# Patient Record
Sex: Female | Born: 1965 | Race: White | Hispanic: No | Marital: Married | Smoking: Current some day smoker
Health system: Southern US, Community
[De-identification: ages and names within clinical notes are randomized; demographics above are authoritative.]

## PROBLEM LIST (undated history)

## (undated) DIAGNOSIS — F419 Anxiety disorder, unspecified: Secondary | ICD-10-CM

## (undated) DIAGNOSIS — R4589 Other symptoms and signs involving emotional state: Secondary | ICD-10-CM

## (undated) DIAGNOSIS — I2699 Other pulmonary embolism without acute cor pulmonale: Secondary | ICD-10-CM

## (undated) DIAGNOSIS — T7840XA Allergy, unspecified, initial encounter: Secondary | ICD-10-CM

## (undated) HISTORY — PX: CHOLECYSTECTOMY: SHX55

## (undated) HISTORY — PX: BACK SURGERY: SHX140

## (undated) HISTORY — DX: Allergy, unspecified, initial encounter: T78.40XA

## (undated) HISTORY — DX: Anxiety disorder, unspecified: F41.9

## (undated) HISTORY — PX: NASAL SINUS SURGERY: SHX719

---

## 1998-01-15 ENCOUNTER — Other Ambulatory Visit: Admission: RE | Admit: 1998-01-15 | Discharge: 1998-01-15 | Payer: Self-pay | Admitting: *Deleted

## 1999-02-11 ENCOUNTER — Other Ambulatory Visit: Admission: RE | Admit: 1999-02-11 | Discharge: 1999-02-11 | Payer: Self-pay | Admitting: *Deleted

## 2000-02-06 ENCOUNTER — Other Ambulatory Visit: Admission: RE | Admit: 2000-02-06 | Discharge: 2000-02-06 | Payer: Self-pay | Admitting: *Deleted

## 2001-03-03 ENCOUNTER — Other Ambulatory Visit: Admission: RE | Admit: 2001-03-03 | Discharge: 2001-03-03 | Payer: Self-pay | Admitting: *Deleted

## 2002-04-20 ENCOUNTER — Other Ambulatory Visit: Admission: RE | Admit: 2002-04-20 | Discharge: 2002-04-20 | Payer: Self-pay | Admitting: *Deleted

## 2003-07-27 ENCOUNTER — Other Ambulatory Visit: Admission: RE | Admit: 2003-07-27 | Discharge: 2003-07-27 | Payer: Self-pay | Admitting: *Deleted

## 2005-07-18 ENCOUNTER — Emergency Department: Payer: Self-pay | Admitting: Emergency Medicine

## 2009-06-08 ENCOUNTER — Ambulatory Visit: Payer: Self-pay | Admitting: Unknown Physician Specialty

## 2010-06-19 ENCOUNTER — Ambulatory Visit: Payer: Self-pay | Admitting: Family Medicine

## 2010-12-16 ENCOUNTER — Emergency Department: Payer: Self-pay | Admitting: Internal Medicine

## 2011-11-17 ENCOUNTER — Ambulatory Visit: Payer: Self-pay

## 2012-02-23 ENCOUNTER — Emergency Department: Payer: Self-pay | Admitting: Emergency Medicine

## 2012-03-17 ENCOUNTER — Emergency Department: Payer: Self-pay | Admitting: Emergency Medicine

## 2012-03-29 ENCOUNTER — Emergency Department: Payer: Self-pay | Admitting: Emergency Medicine

## 2012-03-29 LAB — CBC
HCT: 45.4 % (ref 35.0–47.0)
HGB: 15.7 g/dL (ref 12.0–16.0)
MCH: 33.8 pg (ref 26.0–34.0)
MCHC: 34.5 g/dL (ref 32.0–36.0)
MCV: 98 fL (ref 80–100)
Platelet: 463 10*3/uL — ABNORMAL HIGH (ref 150–440)
RBC: 4.63 10*6/uL (ref 3.80–5.20)
RDW: 15.3 % — ABNORMAL HIGH (ref 11.5–14.5)
WBC: 9.5 10*3/uL (ref 3.6–11.0)

## 2012-03-29 LAB — URINALYSIS, COMPLETE
Bilirubin,UR: NEGATIVE
Glucose,UR: NEGATIVE mg/dL (ref 0–75)
Leukocyte Esterase: NEGATIVE
Nitrite: NEGATIVE
Ph: 5 (ref 4.5–8.0)
Protein: 100
RBC,UR: 10 /HPF (ref 0–5)
Specific Gravity: 1.042 (ref 1.003–1.030)
Squamous Epithelial: 24
WBC UR: 10 /HPF (ref 0–5)

## 2012-03-29 LAB — COMPREHENSIVE METABOLIC PANEL
Albumin: 4.4 g/dL (ref 3.4–5.0)
Alkaline Phosphatase: 98 U/L (ref 50–136)
Anion Gap: 6 — ABNORMAL LOW (ref 7–16)
BUN: 18 mg/dL (ref 7–18)
Bilirubin,Total: 0.9 mg/dL (ref 0.2–1.0)
Calcium, Total: 9.5 mg/dL (ref 8.5–10.1)
Chloride: 108 mmol/L — ABNORMAL HIGH (ref 98–107)
Co2: 28 mmol/L (ref 21–32)
Creatinine: 0.74 mg/dL (ref 0.60–1.30)
EGFR (African American): >60
EGFR (Non-African Amer.): >60
Glucose: 102 mg/dL — ABNORMAL HIGH (ref 65–99)
Osmolality: 285 (ref 275–301)
Potassium: 3.6 mmol/L (ref 3.5–5.1)
SGOT(AST): 21 U/L (ref 15–37)
SGPT (ALT): 50 U/L (ref 12–78)
Sodium: 142 mmol/L (ref 136–145)
Total Protein: 8.2 g/dL (ref 6.4–8.2)

## 2012-03-29 LAB — LIPASE, BLOOD: Lipase: 156 U/L (ref 73–393)

## 2012-03-29 LAB — AMYLASE: Amylase: 49 U/L (ref 25–115)

## 2012-03-29 LAB — PREGNANCY, URINE: Pregnancy Test, Urine: NEGATIVE m[IU]/mL

## 2012-03-31 LAB — URINE CULTURE

## 2012-12-12 ENCOUNTER — Emergency Department: Payer: Self-pay | Admitting: Internal Medicine

## 2014-07-19 ENCOUNTER — Ambulatory Visit: Payer: Self-pay | Admitting: Physical Medicine and Rehabilitation

## 2014-11-09 ENCOUNTER — Emergency Department: Admit: 2014-11-09 | Disposition: A | Discharge status: Home / Self Care | Payer: Self-pay | Admitting: Student

## 2014-12-24 ENCOUNTER — Emergency Department
Admission: EM | Admit: 2014-12-24 | Discharge: 2014-12-24 | Disposition: A | Discharge status: Home / Self Care | Payer: Medicare Other | Attending: Emergency Medicine | Admitting: Emergency Medicine

## 2014-12-24 ENCOUNTER — Encounter: Payer: Self-pay | Admitting: *Deleted

## 2014-12-24 DIAGNOSIS — Z720 Tobacco use: Secondary | ICD-10-CM | POA: Diagnosis not present

## 2014-12-24 DIAGNOSIS — Z79899 Other long term (current) drug therapy: Secondary | ICD-10-CM | POA: Insufficient documentation

## 2014-12-24 DIAGNOSIS — M5117 Intervertebral disc disorders with radiculopathy, lumbosacral region: Secondary | ICD-10-CM | POA: Insufficient documentation

## 2014-12-24 DIAGNOSIS — M545 Low back pain: Secondary | ICD-10-CM | POA: Diagnosis present

## 2014-12-24 DIAGNOSIS — Z880 Allergy status to penicillin: Secondary | ICD-10-CM | POA: Insufficient documentation

## 2014-12-24 DIAGNOSIS — M5137 Other intervertebral disc degeneration, lumbosacral region: Secondary | ICD-10-CM

## 2014-12-24 DIAGNOSIS — R4589 Other symptoms and signs involving emotional state: Secondary | ICD-10-CM | POA: Insufficient documentation

## 2014-12-24 DIAGNOSIS — Z791 Long term (current) use of non-steroidal anti-inflammatories (NSAID): Secondary | ICD-10-CM | POA: Insufficient documentation

## 2014-12-24 DIAGNOSIS — M5432 Sciatica, left side: Secondary | ICD-10-CM

## 2014-12-24 HISTORY — DX: Other symptoms and signs involving emotional state: R45.89

## 2014-12-24 MED ORDER — HYDROMORPHONE HCL 1 MG/ML IJ SOLN: 0.5000 mg | Freq: Once | INTRAMUSCULAR | Status: DC

## 2014-12-24 MED ORDER — HYDROCODONE-ACETAMINOPHEN 5-325 MG PO TABS: 1.0000 | ORAL_TABLET | ORAL | Status: DC | PRN

## 2014-12-24 MED ORDER — MELOXICAM 15 MG PO TABS: 15.0000 mg | ORAL_TABLET | Freq: Every day | ORAL | Status: DC

## 2014-12-24 MED ORDER — MEPERIDINE HCL 25 MG/ML IJ SOLN
INTRAMUSCULAR | Status: AC
  Administered 2014-12-24: 25 mg via INTRAMUSCULAR
  Filled 2014-12-24: qty 1

## 2014-12-24 MED ORDER — METOCLOPRAMIDE HCL 5 MG/ML IJ SOLN: 10.0000 mg | Freq: Once | INTRAMUSCULAR | Status: DC

## 2014-12-24 MED ORDER — MEPERIDINE HCL 25 MG/ML IJ SOLN
25.0000 mg | Freq: Once | INTRAMUSCULAR | Status: AC
  Administered 2014-12-24: 25 mg via INTRAMUSCULAR

## 2014-12-24 MED ORDER — CYCLOBENZAPRINE HCL 5 MG PO TABS: 5.0000 mg | ORAL_TABLET | Freq: Three times a day (TID) | ORAL | Status: DC | PRN

## 2014-12-24 NOTE — Discharge Instructions (Signed)
Radicular Pain Radicular pain in either the arm or leg is usually from a bulging or herniated disk in the spine. A piece of the herniated disk may press against the nerves as the nerves exit the spine. This causes pain which is felt at the tips of the nerves down the arm or leg. Other causes of radicular pain may include:  Fractures.  Heart disease.  Cancer.  An abnormal and usually degenerative state of the nervous system or nerves (neuropathy). Diagnosis may require CT or MRI scanning to determine the primary cause.  Nerves that start at the neck (nerve roots) may cause radicular pain in the outer shoulder and arm. It can spread down to the thumb and fingers. The symptoms vary depending on which nerve root has been affected. In most cases radicular pain improves with conservative treatment. Neck problems may require physical therapy, a neck collar, or cervical traction. Treatment may take many weeks, and surgery may be considered if the symptoms do not improve.  Conservative treatment is also recommended for sciatica. Sciatica causes pain to radiate from the lower back or buttock area down the leg into the foot. Often there is a history of back problems. Most patients with sciatica are better after 2 to 4 weeks of rest and other supportive care. Short term bed rest can reduce the disk pressure considerably. Sitting, however, is not a good position since this increases the pressure on the disk. You should avoid bending, lifting, and all other activities which make the problem worse. Traction can be used in severe cases. Surgery is usually reserved for patients who do not improve within the first months of treatment. Only take over-the-counter or prescription medicines for pain, discomfort, or fever as directed by your caregiver. Narcotics and muscle relaxants may help by relieving more severe pain and spasm and by providing mild sedation. Cold or massage can give significant relief. Spinal manipulation  is not recommended. It can increase the degree of disc protrusion. Epidural steroid injections are often effective treatment for radicular pain. These injections deliver medicine to the spinal nerve in the space between the protective covering of the spinal cord and back bones (vertebrae). Your caregiver can give you more information about steroid injections. These injections are most effective when given within two weeks of the onset of pain.  You should see your caregiver for follow up care as recommended. A program for neck and back injury rehabilitation with stretching and strengthening exercises is an important part of management.  SEEK IMMEDIATE MEDICAL CARE IF:  You develop increased pain, weakness, or numbness in your arm or leg.  You develop difficulty with bladder or bowel control.  You develop abdominal pain. Document Released: 09/04/2004 Document Revised: 10/20/2011 Document Reviewed: 11/20/2008 [facility] Patient Information 2015 [place], [place]. This information is not intended to replace advice given to you by your health care provider. Make sure you discuss any questions you have with your health care provider.   Take the prescription meds as directed.  Follow-up with your provider/specialist as scheduled.

## 2014-12-24 NOTE — ED Provider Notes (Signed)
[facility] Emergency Department Provider Note ?____________________________________________ ? Time seen: 1338 ? I have reviewed the triage vital signs and the nursing notes. ________ HISTORY ? Chief Complaint Back Pain  HPI  Lorraine Rose is a 49 y.o. female who reports to the ED with flare of her left sciatic pain. She reports that over the last few days she's had increasing left lotion any pain above her baseline. She denies injury, trauma, slip or fall, she also denies incontinence. She was here about 6-8 weeks ago for treatment of the same and reported response to the injection medicines and the perception medicines she was sent home with. She is expected to see Dr. Sharyne Peach [facility] neurology for surgical consultation. Her surgical history is significant for L5-S1 fusion and laminectomy. She normally takes ibuprofen and Tylenol for her pain, denies any current or previous prescription medications, other than those assigned on her last ED visit.  Past Medical History  Diagnosis Date  . Depressed affect    Patient Active Problem List   Diagnosis Date Noted  . Depressed affect   ? History reviewed. No pertinent past surgical history. ? Current Outpatient Rx  Name  Route  Sig  Dispense  Refill  . cyclobenzaprine (FLEXERIL) 5 MG tablet   Oral   Take 1 tablet (5 mg total) by mouth 3 (three) times daily as needed for muscle spasms.   15 tablet   0   . HYDROcodone-acetaminophen (NORCO) 5-325 MG per tablet   Oral   Take 1 tablet by mouth every 4 (four) hours as needed for moderate pain.   6 tablet   0   . meloxicam (MOBIC) 15 MG tablet   Oral   Take 1 tablet (15 mg total) by mouth daily.   30 tablet   0   ? Allergies Penicillins ? No family history on file. ? Social History History  Substance Use Topics  . Smoking status: Current Some Day Smoker  . Smokeless tobacco: Not on file  . Alcohol Use: No   Review of Systems  Constitutional:  Negative for fever. HEENT: Negative for head trauma, visual changes, sore throat. Cardiovascular: Negative for chest pain. Respiratory: Negative for shortness of breath. Musculoskeletal: Negative for back pain. Skin: Negative for rash. Neurological: Negative for headaches, focal weakness or numbness.  10-point ROS otherwise negative. ____________________________________________  PHYSICAL EXAM:  VITAL SIGNS: ED Triage Vitals  Enc Vitals Group     BP 12/24/14 1135 133/89 mmHg     Pulse Rate 12/24/14 1135 113     Resp --      Temp 12/24/14 1135 98.1 F (36.7 C)     Temp src --      SpO2 12/24/14 1135 99 %     Weight 12/24/14 1135 125 lb (56.7 kg)     Height 12/24/14 1135 5\' 6"  (1.676 m)     Head Cir --      Peak Flow --      Pain Score 12/24/14 1136 10     Pain Loc --      Pain Edu? --      Excl. in [place]? --    Constitutional: Alert and oriented. Well appearing and in no distress. HEENT:Normocephalic and atraumatic.  PERRL. Normal extraocular movements.  No congestion/rhinnorhea. Mucous membranes are moist. Neck: Supple. No cervical lymphadenopathy. Cardiovascular: Normal rate, regular rhythm. No murmurs, rubs, or gallops. Normal and symmetric distal pulses are present in all extremities.  Respiratory: Normal respiratory effort without  tachypnea. Breath sounds are clear and equal bilaterally. No wheezes/rales/rhonchi. Gastrointestinal: Soft and nontender. No distention. No abdominal bruits. There is no CVA tenderness. Musculoskeletal: Nontender with normal range of motion in all extremities. No joint effusions.  No lower extremity tenderness nor edema. Neurologic:  Normal speech and language. CN II-XII grossly intact. No gait instability. NL DTRs bilaterally. Normal toe dorsiflexion. Skin:  Skin is warm, dry and intact. No rash noted. Psychiatric: Mood and affect are normal. Patient exhibits appropriate insight and judgment. _____________ PROCEDURES ? Procedure(s) performed:  Demerol 25 mg IM  Critical Care performed: None ______________________________________________________ INITIAL IMPRESSION / ASSESSMENT AND PLAN / ED COURSE ? Acute flare of chronic radicular back pain. Prescription meds given. Follow-up with [facility] as scheduled.    Pertinent labs & imaging results that were available during my care of the patient were reviewed by me and considered in my medical decision making (see chart for details). ____________________________________________ FINAL CLINICAL IMPRESSION(S) / ED DIAGNOSES?  Final diagnoses:  Sciatica, left      Melvenia Needles, PA-C 12/24/14 1927  Lavonia Drafts, MD 12/25/14 [phone]

## 2014-12-24 NOTE — ED Notes (Signed)
States meloxicam worked well when on it

## 2015-10-23 ENCOUNTER — Encounter (INDEPENDENT_AMBULATORY_CARE_PROVIDER_SITE_OTHER): Payer: Self-pay

## 2015-10-23 ENCOUNTER — Ambulatory Visit: Payer: Medicare Other | Attending: Pain Medicine | Admitting: Pain Medicine

## 2015-10-23 ENCOUNTER — Encounter: Payer: Self-pay | Admitting: Pain Medicine

## 2015-10-23 VITALS — BP 118/87 | HR 94 | Temp 98.3°F | Resp 16 | Ht 66.0 in | Wt 130.0 lb

## 2015-10-23 DIAGNOSIS — M5416 Radiculopathy, lumbar region: Secondary | ICD-10-CM

## 2015-10-23 DIAGNOSIS — M545 Low back pain: Secondary | ICD-10-CM | POA: Insufficient documentation

## 2015-10-23 DIAGNOSIS — G4700 Insomnia, unspecified: Secondary | ICD-10-CM | POA: Insufficient documentation

## 2015-10-23 DIAGNOSIS — M4690 Unspecified inflammatory spondylopathy, site unspecified: Secondary | ICD-10-CM | POA: Insufficient documentation

## 2015-10-23 DIAGNOSIS — M2578 Osteophyte, vertebrae: Secondary | ICD-10-CM | POA: Diagnosis not present

## 2015-10-23 DIAGNOSIS — M533 Sacrococcygeal disorders, not elsewhere classified: Secondary | ICD-10-CM | POA: Insufficient documentation

## 2015-10-23 DIAGNOSIS — Z9889 Other specified postprocedural states: Secondary | ICD-10-CM | POA: Diagnosis not present

## 2015-10-23 DIAGNOSIS — M79606 Pain in leg, unspecified: Secondary | ICD-10-CM | POA: Diagnosis present

## 2015-10-23 DIAGNOSIS — M5136 Other intervertebral disc degeneration, lumbar region: Secondary | ICD-10-CM | POA: Insufficient documentation

## 2015-10-23 DIAGNOSIS — Z981 Arthrodesis status: Secondary | ICD-10-CM | POA: Diagnosis not present

## 2015-10-23 DIAGNOSIS — M5116 Intervertebral disc disorders with radiculopathy, lumbar region: Secondary | ICD-10-CM | POA: Diagnosis not present

## 2015-10-23 DIAGNOSIS — F418 Other specified anxiety disorders: Secondary | ICD-10-CM | POA: Insufficient documentation

## 2015-10-23 DIAGNOSIS — M47816 Spondylosis without myelopathy or radiculopathy, lumbar region: Secondary | ICD-10-CM

## 2015-10-23 NOTE — Progress Notes (Signed)
Safety precautions to be maintained throughout the outpatient stay will include: orient to surroundings, keep bed in low position, maintain call bell within reach at all times, provide assistance with transfer out of bed and ambulation.  

## 2015-10-23 NOTE — Progress Notes (Signed)
Subjective:    Patient ID: Lorraine Rose, female    DOB: 1965-08-17, 50 y.o.   MRN: [id number]  HPI The patient is a 50 year old female who comes to pain management at the request of Dr Leanord Hawking further evaluation and treatment of lower back and lower extremity pain. The patient states that she is with prior surgical intervention of the lumbar region with initial surgery consisting of laminectomy and 2004 and fusion in 2006. The patient also has undergone interventional treatment with to procedures performed by Dr. Sharlet Salina Asst. of lumbar epidural steroid injections. The patient states that she had the insidious onset of her lower back pain. The patient states that the pain involves the lower back and lower extremity on the left predominantly on today's visit we reviewed patient's MRI and discussed patient's symptoms. The patient stated that her pain was agonizing feeling await horrible nagging pressure-like pulsating sharp shooting pain that awakened her from sleep and interfered with ability to go to sleep. The patient stated that the pain was associated with weakness tingling spasms in the pain increased with walking and sitting standing squatting stooping lifting bending area the patient stated that the pain had decreased in the past with cold packs and resting. Also discussed patient's prior medications and patient stated that she was without medications prescribed for pain this time. The patient stated that her previous medications have consisted of Neurontin meloxicam and hydrocodone acetaminophen (5/325 mg). We informed patient that we would not be able to promise that he would ever prescribe medications for her. We informed patient that we would decide upon prescribing medications for her based on further evaluation of her. After review patient's condition we suggested that patient be considered for lumbar facet, medial branch nerve, blocks to be performed at time of return appointment. We also  discussed other procedures including lumbosacral selective nerve root blocks lumbar epidural steroid injections and other procedures including radiofrequency rhizolysis and spinal cord stimulation. The patient was with understanding and wished to proceed with lumbar facet, medial branch nerve, blocks to be performed at time return appointment. All agreed to suggested treatment plan.   Review of Systems   Cardiovascular: Unremarkable   Pulmonary: Unremarkable  Neurological: Unremarkable  Psychological: Anxiety Depression Panic attacks Insomnia  Gastrointestinal: Unremarkable  Genitourinary: Unremarkable  Hematologic: Unremarkable  Endocrine: Unremarkable  Rheumatological: Unremarkable  Musculoskeletal: Unremarkable  Other significant: Unremarkable     Objective:   Physical Exam  There was tenderness to palpation of paraspinal musculature in the cervical region cervical facet region of mild degree with mild to moderate tenderness of the splenius capitis and occipitalis musculature region. The patient appeared to be unremarkable Spurling's maneuver. There was minimal tenderness of the acromial clavicular and glenohumeral joint regions. The patient appeared to be with bilaterally equal grip strength. Tinel and Phalen's maneuver were without increased pain of significant degree. Palpation of the thoracic region thoracic facet region was attends to palpation of mild degree with moderate tenderness to palpation of the lower thoracic region without crepitus noted. There was tenderness over the lumbar facet lumbar paraspinal musculature region with well-healed surgical scar of the lumbar region without increased warmth and erythema in the region of the scar. Extension and palpation of the lumbar facets reproduced moderately severe to severe pain with lateral bending rotation extension and palpation of the lumbar facets reproducing severe pain. There was tenderness of the PSIS and  PII S region of moderately severe degree as well. There was mild tenderness along the  greater trochanteric region and iliotibial band region. Straight leg raising was tolerated to approximately 20 without a definite increase of pain with dorsiflexion noted. There was negative clonus negative Homans. DTRs appeared to be trace. EHL strength was decreased. There was negative clonus negative Homans. No definite sensory deficit or dermatomal distribution detected. Abdomen nontender with no costovertebral tenderness noted.      Assessment & Plan:   Degenerative disc disease lumbar spine T12-L1: No significant disc bulge. No evidence of neural foraminal  stenosis. No central canal stenosis.   L1-L2: No significant disc bulge. No evidence of neural foraminal  stenosis. No central canal stenosis.   L2-L3: No significant disc bulge. No evidence of neural foraminal  stenosis. No central canal stenosis.   L3-L4: No significant disc bulge. No evidence of neural foraminal  stenosis. No central canal stenosis.   L4-L5: No significant disc bulge. No evidence of neural foraminal  stenosis. No central canal stenosis. Mild bilateral facet  arthropathy.   L5-S1: Posterior spinal fusion. Left paracentral disc osteophyte  complex abutting the left intraspinal S1 nerve root. No evidence of  neural foraminal stenosis. No central canal stenosis.   IMPRESSION:  1. At L5-S1 there is posterior spinal fusion. There is a left  paracentral disc osteophyte complex abutting the left intraspinal S1  nerve root.   Status post lumbar surgery 2 (lumbar laminectomy and lumbar fusion)  Lumbar facet syndrome  Lumbar radiculopathy  Sacroiliac joint dysfunction     PLAN  Continue present medications . Prior medications have included Neurontin (gabapentin) meloxicam and hydrocodone acetaminophen(05/325mg ) . Patient not taking these medications at this time   Lumbar facet, medial branch  nerve, blocks to be performed at time of return appointment  F/U PCP Dr.Lithavong  for evaliation of  BP and general medical  condition  F/U surgical evaluation. May consider pending follow-up evaluations  F/U neurological evaluation. May consider PNCV/EMG studies and other studies pending follow-up evaluations  May consider radiofrequency rhizolysis or intraspinal procedures pending response to present treatment and F/U evaluation   Patient to call [facility] should patient have concerns prior to scheduled return appointment.

## 2015-10-23 NOTE — Patient Instructions (Addendum)
Continue present medications . Prior medications have included Neurontin (gabapentin) meloxicam and hydrocodone acetaminophen(05/325mg ) . Patient not taking these medications at this time   Lumbar facet, medial branch nerve, blocks to be performed at time of return appointment  F/U PCP Dr.Lithavong  for evaliation of  BP and general medical  condition  F/U surgical evaluation. May consider pending follow-up evaluations  F/U neurological evaluation. May consider PNCV/EMG studies and other studies pending follow-up evaluations  May consider radiofrequency rhizolysis or intraspinal procedures pending response to present treatment and F/U evaluation   Patient to call Pain Management Center should patient have concerns prior to scheduled return appointment. Pain Management Discharge Instructions  General Discharge Instructions :  If you need to reach your doctor call: Monday-Friday 8:00 am - 4:00 pm at [phone] or toll free [phone].  After clinic hours [phone] to have operator reach doctor.  Bring all of your medication bottles to all your appointments in the pain clinic.  To cancel or reschedule your appointment with Pain Management please remember to call 24 hours in advance to avoid a fee.  Refer to the educational materials which you have been given on: General Risks, I had my Procedure. Discharge Instructions, Post Sedation.  Post Procedure Instructions:  The drugs you were given will stay in your system until tomorrow, so for the next 24 hours you should not drive, make any legal decisions or drink any alcoholic beverages.  You may eat anything you prefer, but it is better to start with liquids then soups and crackers, and gradually work up to solid foods.  Please notify your doctor immediately if you have any unusual bleeding, trouble breathing or pain that is not related to your normal pain.  Depending on the type of procedure that was done, some parts of your body  may feel week and/or numb.  This usually clears up by tonight or the next day.  Walk with the use of an assistive device or accompanied by an adult for the 24 hours.  You may use ice on the affected area for the first 24 hours.  Put ice in a Ziploc bag and cover with a towel and place against area 15 minutes on 15 minutes off.  You may switch to heat after 24 hours.GENERAL RISKS AND COMPLICATIONS  What are the risk, side effects and possible complications? Generally speaking, most procedures are safe.  However, with any procedure there are risks, side effects, and the possibility of complications.  The risks and complications are dependent upon the sites that are lesioned, or the type of nerve block to be performed.  The closer the procedure is to the spine, the more serious the risks are.  Great care is taken when placing the radio frequency needles, block needles or lesioning probes, but sometimes complications can occur. 1. Infection: Any time there is an injection through the skin, there is a risk of infection.  This is why sterile conditions are used for these blocks.  There are four possible types of infection. 1. Localized skin infection. 2. Central Nervous System Infection-This can be in the form of Meningitis, which can be deadly. 3. Epidural Infections-This can be in the form of an epidural abscess, which can cause pressure inside of the spine, causing compression of the spinal cord with subsequent paralysis. This would require an emergency surgery to decompress, and there are no guarantees that the patient would recover from the paralysis. 4. Discitis-This is an infection of the intervertebral discs.  It occurs  in about 1% of discography procedures.  It is difficult to treat and it may lead to surgery.        2. Pain: the needles have to go through skin and soft tissues, will cause soreness.       3. Damage to internal structures:  The nerves to be lesioned may be near blood vessels or     other nerves which can be potentially damaged.       4. Bleeding: Bleeding is more common if the patient is taking blood thinners such as  aspirin, Coumadin, Ticiid, Plavix, etc., or if he/she have some genetic predisposition  such as hemophilia. Bleeding into the spinal canal can cause compression of the spinal  cord with subsequent paralysis.  This would require an emergency surgery to  decompress and there are no guarantees that the patient would recover from the  paralysis.       5. Pneumothorax:  Puncturing of a lung is a possibility, every time a needle is introduced in  the area of the chest or upper back.  Pneumothorax refers to free air around the  collapsed lung(s), inside of the thoracic cavity (chest cavity).  Another two possible  complications related to a similar event would include: Hemothorax and Chylothorax.   These are variations of the Pneumothorax, where instead of air around the collapsed  lung(s), you may have blood or chyle, respectively.       6. Spinal headaches: They may occur with any procedures in the area of the spine.       7. Persistent CSF (Cerebro-Spinal Fluid) leakage: This is a rare problem, but may occur  with prolonged intrathecal or epidural catheters either due to the formation of a fistulous  track or a dural tear.       8. Nerve damage: By working so close to the spinal cord, there is always a possibility of  nerve damage, which could be as serious as a permanent spinal cord injury with  paralysis.       9. Death:  Although rare, severe deadly allergic reactions known as "Anaphylactic  reaction" can occur to any of the medications used.      10. Worsening of the symptoms:  We can always make thing worse.  What are the chances of something like this happening? Chances of any of this occuring are extremely low.  By statistics, you have more of a chance of getting killed in a motor vehicle accident: while driving to the hospital than any of the above occurring .   Nevertheless, you should be aware that they are possibilities.  In general, it is similar to taking a shower.  Everybody knows that you can slip, hit your head and get killed.  Does that mean that you should not shower again?  Nevertheless always keep in mind that statistics do not mean anything if you happen to be on the wrong side of them.  Even if a procedure has a 1 (one) in a 1,000,000 (million) chance of going wrong, it you happen to be that one..Also, keep in mind that by statistics, you have more of a chance of having something go wrong when taking medications.  Who should not have this procedure? If you are on a blood thinning medication (e.g. Coumadin, Plavix, see list of "Blood Thinners"), or if you have an active infection going on, you should not have the procedure.  If you are taking any blood thinners, please inform your physician.  How  should I prepare for this procedure?  Do not eat or drink anything at least six hours prior to the procedure.  Bring a driver with you .  It cannot be a taxi.  Come accompanied by an adult that can drive you back, and that is strong enough to help you if your legs get weak or numb from the local anesthetic.  Take all of your medicines the morning of the procedure with just enough water to swallow them.  If you have diabetes, make sure that you are scheduled to have your procedure done first thing in the morning, whenever possible.  If you have diabetes, take only half of your insulin dose and notify our nurse that you have done so as soon as you arrive at the clinic.  If you are diabetic, but only take blood sugar pills (oral hypoglycemic), then do not take them on the morning of your procedure.  You may take them after you have had the procedure.  Do not take aspirin or any aspirin-containing medications, at least eleven (11) days prior to the procedure.  They may prolong bleeding.  Wear loose fitting clothing that may be easy to take off and  that you would not mind if it got stained with Betadine or blood.  Do not wear any jewelry or perfume  Remove any nail coloring.  It will interfere with some of our monitoring equipment.  NOTE: Remember that this is not meant to be interpreted as a complete list of all possible complications.  Unforeseen problems may occur.  BLOOD THINNERS The following drugs contain aspirin or other products, which can cause increased bleeding during surgery and should not be taken for 2 weeks prior to and 1 week after surgery.  If you should need take something for relief of minor pain, you may take acetaminophen which is found in Tylenol,m Datril, Anacin-3 and Panadol. It is not blood thinner. The products listed below are.  Do not take any of the products listed below in addition to any listed on your instruction sheet.  A.P.C or A.P.C with Codeine Codeine Phosphate Capsules #3 Ibuprofen Ridaura  ABC compound Congesprin Imuran rimadil  Advil Cope Indocin Robaxisal  Alka-Seltzer Effervescent Pain Reliever and Antacid Coricidin or Coricidin-D  Indomethacin Rufen  Alka-Seltzer plus Cold Medicine Cosprin Ketoprofen S-A-C Tablets  Anacin Analgesic Tablets or Capsules Coumadin Korlgesic Salflex  Anacin Extra Strength Analgesic tablets or capsules CP-2 Tablets Lanoril Salicylate  Anaprox Cuprimine Capsules Levenox Salocol  Anexsia-D Dalteparin Magan Salsalate  Anodynos Darvon compound Magnesium Salicylate Sine-off  Ansaid Dasin Capsules Magsal Sodium Salicylate  Anturane Depen Capsules Marnal Soma  APF Arthritis pain formula Dewitt's Pills Measurin Stanback  Argesic Dia-Gesic Meclofenamic Sulfinpyrazone  Arthritis Bayer Timed Release Aspirin Diclofenac Meclomen Sulindac  Arthritis pain formula Anacin Dicumarol Medipren Supac  Analgesic (Safety coated) Arthralgen Diffunasal Mefanamic Suprofen  Arthritis Strength Bufferin Dihydrocodeine Mepro Compound Suprol  Arthropan liquid Dopirydamole Methcarbomol with  Aspirin Synalgos  ASA tablets/Enseals Disalcid Micrainin Tagament  Ascriptin Doan's Midol Talwin  Ascriptin A/D Dolene Mobidin Tanderil  Ascriptin Extra Strength Dolobid Moblgesic Ticlid  Ascriptin with Codeine Doloprin or Doloprin with Codeine Momentum Tolectin  Asperbuf Duoprin Mono-gesic Trendar  Aspergum Duradyne Motrin or Motrin IB Triminicin  Aspirin plain, buffered or enteric coated Durasal Myochrisine Trigesic  Aspirin Suppositories Easprin Nalfon Trillsate  Aspirin with Codeine Ecotrin Regular or Extra Strength Naprosyn Uracel  Atromid-S Efficin Naproxen Ursinus  Auranofin Capsules Elmiron Neocylate Vanquish  Axotal Emagrin Norgesic Verin  Azathioprine Empirin  or Empirin with Codeine Normiflo Vitamin E  Azolid Emprazil Nuprin Voltaren  Bayer Aspirin plain, buffered or children's or timed BC Tablets or powders Encaprin Orgaran Warfarin Sodium  Buff-a-Comp Enoxaparin Orudis Zorpin  Buff-a-Comp with Codeine Equegesic Os-Cal-Gesic   Buffaprin Excedrin plain, buffered or Extra Strength Oxalid   Bufferin Arthritis Strength Feldene Oxphenbutazone   Bufferin plain or Extra Strength Feldene Capsules Oxycodone with Aspirin   Bufferin with Codeine Fenoprofen Fenoprofen Pabalate or Pabalate-SF   Buffets II Flogesic Panagesic   Buffinol plain or Extra Strength Florinal or Florinal with Codeine Panwarfarin   Buf-Tabs Flurbiprofen Penicillamine   Butalbital Compound Four-way cold tablets Penicillin   Butazolidin Fragmin Pepto-Bismol   Carbenicillin Geminisyn Percodan   Carna Arthritis Reliever Geopen Persantine   Carprofen Gold's salt Persistin   Chloramphenicol Goody's Phenylbutazone   Chloromycetin Haltrain Piroxlcam   Clmetidine heparin Plaquenil   Cllnoril Hyco-pap Ponstel   Clofibrate Hydroxy chloroquine Propoxyphen         Before stopping any of these medications, be sure to consult the physician who ordered them.  Some, such as Coumadin (Warfarin) are ordered to prevent or  treat serious conditions such as "deep thrombosis", "pumonary embolisms", and other heart problems.  The amount of time that you may need off of the medication may also vary with the medication and the reason for which you were taking it.  If you are taking any of these medications, please make sure you notify your pain physician before you undergo any procedures.         Facet Joint Block The facet joints connect the bones of the spine (vertebrae). They make it possible for you to bend, twist, and make other movements with your spine. They also prevent you from overbending, overtwisting, and making other excessive movements.  A facet joint block is a procedure where a numbing medicine (anesthetic) is injected into a facet joint. Often, a type of anti-inflammatory medicine called a steroid is also injected. A facet joint block may be done for two reasons:  2. Diagnosis. A facet joint block may be done as a test to see whether neck or back pain is caused by a worn-down or infected facet joint. If the pain gets better after a facet joint block, it means the pain is probably coming from the facet joint. If the pain does not get better, it means the pain is probably not coming from the facet joint.  3. Therapy. A facet joint block may be done to relieve neck or back pain caused by a facet joint. A facet joint block is only done as a therapy if the pain does not improve with medicine, exercise programs, physical therapy, and other forms of pain management. LET [facility] CARE PROVIDER KNOW ABOUT:   Any allergies you have.   All medicines you are taking, including vitamins, herbs, eyedrops, and over-the-counter medicines and creams.   Previous problems you or members of your family have had with the use of anesthetics.   Any blood disorders you have had.   Other health problems you have. RISKS AND COMPLICATIONS Generally, having a facet joint block is safe. However, as with any procedure,  complications can occur. Possible complications associated with having a facet joint block include:   Bleeding.   Injury to a nerve near the injection site.   Pain at the injection site.   Weakness or numbness in areas controlled by nerves near the injection site.   Infection.   Temporary fluid retention.  Allergic reaction to anesthetics or medicines used during the procedure. BEFORE THE PROCEDURE   Follow your health care provider's instructions if you are taking dietary supplements or medicines. You may need to stop taking them or reduce your dosage.   Do not take any new dietary supplements or medicines without asking your health care provider first.   Follow your health care provider's instructions about eating and drinking before the procedure. You may need to stop eating and drinking several hours before the procedure.   Arrange to have an adult drive you home after the procedure. PROCEDURE 12. You may need to remove your clothing and dress in an open-back gown so that your health care provider can access your spine.  13. The procedure will be done while you are lying on an X-ray table. Most of the time you will be asked to lie on your stomach, but you may be asked to lie in a different position if an injection will be made in your neck.  14. Special machines will be used to monitor your oxygen levels, heart rate, and blood pressure.  15. If an injection will be made in your neck, an intravenous (IV) tube will be inserted into one of your veins. Fluids and medicine will flow directly into your body through the IV tube.  16. The area over the facet joint where the injection will be made will be cleaned with an antiseptic soap. The surrounding skin will be covered with sterile drapes.  17. An anesthetic will be applied to your skin to make the injection area numb. You may feel a temporary stinging or burning sensation.  18. A video X-ray machine will be used to  locate the joint. A contrast dye may be injected into the facet joint area to help with locating the joint.  19. When the joint is located, an anesthetic medicine will be injected into the joint through the needle.  51. Your health care provider will ask you whether you feel pain relief. If you do feel relief, a steroid may be injected to provide pain relief for a longer period of time. If you do not feel relief or feel only partial relief, additional injections of an anesthetic may be made in other facet joints.  21. The needle will be removed, the skin will be cleansed, and bandages will be applied.  AFTER THE PROCEDURE   You will be observed for 15-30 minutes before being allowed to go home. Do not drive. Have an adult drive you or take a taxi or public transportation instead.   If you feel pain relief, the pain will return in several hours or days when the anesthetic wears off.   You may feel pain relief 2-14 days after the procedure. The amount of time this relief lasts varies from person to person.   It is normal to feel some tenderness over the injected area(s) for 2 days following the procedure.   If you have diabetes, you may have a temporary increase in blood sugar.   This information is not intended to replace advice given to you by your health care provider. Make sure you discuss any questions you have with your health care provider.   Document Released: 12/17/2006 Document Revised: 08/18/2014 Document Reviewed: 05/17/2012 Elsevier Interactive Patient Education 2016 [place] patient stated that she previously had been prescribed Neurontin, meloxicam, and hydrocodone acetaminophen(5/325 mg) the patient stated

## 2015-10-31 ENCOUNTER — Encounter: Payer: Self-pay | Admitting: Pain Medicine

## 2015-10-31 ENCOUNTER — Ambulatory Visit: Payer: Medicare Other | Attending: Pain Medicine | Admitting: Pain Medicine

## 2015-10-31 VITALS — BP 130/85 | HR 73 | Temp 98.3°F | Resp 18 | Ht 66.0 in | Wt 140.0 lb

## 2015-10-31 DIAGNOSIS — M5416 Radiculopathy, lumbar region: Secondary | ICD-10-CM

## 2015-10-31 DIAGNOSIS — M5136 Other intervertebral disc degeneration, lumbar region: Secondary | ICD-10-CM | POA: Diagnosis not present

## 2015-10-31 DIAGNOSIS — M2578 Osteophyte, vertebrae: Secondary | ICD-10-CM | POA: Diagnosis not present

## 2015-10-31 DIAGNOSIS — M51369 Other intervertebral disc degeneration, lumbar region without mention of lumbar back pain or lower extremity pain: Secondary | ICD-10-CM

## 2015-10-31 DIAGNOSIS — M545 Low back pain: Secondary | ICD-10-CM | POA: Diagnosis present

## 2015-10-31 DIAGNOSIS — M533 Sacrococcygeal disorders, not elsewhere classified: Secondary | ICD-10-CM

## 2015-10-31 DIAGNOSIS — Z981 Arthrodesis status: Secondary | ICD-10-CM

## 2015-10-31 DIAGNOSIS — M79606 Pain in leg, unspecified: Secondary | ICD-10-CM | POA: Insufficient documentation

## 2015-10-31 DIAGNOSIS — M47816 Spondylosis without myelopathy or radiculopathy, lumbar region: Secondary | ICD-10-CM

## 2015-10-31 DIAGNOSIS — Z9889 Other specified postprocedural states: Secondary | ICD-10-CM

## 2015-10-31 MED ORDER — FENTANYL CITRATE (PF) 100 MCG/2ML IJ SOLN: 100.0000 ug | Freq: Once | INTRAMUSCULAR | Status: DC

## 2015-10-31 MED ORDER — LACTATED RINGERS IV SOLN: 1000.0000 mL | INTRAVENOUS | Status: DC

## 2015-10-31 MED ORDER — CIPROFLOXACIN IN D5W 400 MG/200ML IV SOLN: 400.0000 mg | Freq: Once | INTRAVENOUS | Status: DC

## 2015-10-31 MED ORDER — TRIAMCINOLONE ACETONIDE 40 MG/ML IJ SUSP
INTRAMUSCULAR | Status: AC
  Administered 2015-10-31: 10:00:00
  Filled 2015-10-31: qty 1

## 2015-10-31 MED ORDER — BUPIVACAINE HCL (PF) 0.25 % IJ SOLN
INTRAMUSCULAR | Status: AC
  Administered 2015-10-31: 10:00:00
  Filled 2015-10-31: qty 30

## 2015-10-31 MED ORDER — FENTANYL CITRATE (PF) 100 MCG/2ML IJ SOLN
INTRAMUSCULAR | Status: AC
  Administered 2015-10-31: 100 ug via INTRAVENOUS
  Filled 2015-10-31: qty 2

## 2015-10-31 MED ORDER — TRIAMCINOLONE ACETONIDE 40 MG/ML IJ SUSP: 40.0000 mg | Freq: Once | INTRAMUSCULAR | Status: DC

## 2015-10-31 MED ORDER — MIDAZOLAM HCL 5 MG/5ML IJ SOLN
INTRAMUSCULAR | Status: AC
  Administered 2015-10-31: 5 mg via INTRAVENOUS
  Filled 2015-10-31: qty 5

## 2015-10-31 MED ORDER — ORPHENADRINE CITRATE 30 MG/ML IJ SOLN: 60.0000 mg | Freq: Once | INTRAMUSCULAR | Status: DC

## 2015-10-31 MED ORDER — CIPROFLOXACIN IN D5W 400 MG/200ML IV SOLN
INTRAVENOUS | Status: AC
  Administered 2015-10-31: 400 mg via INTRAVENOUS
  Filled 2015-10-31: qty 200

## 2015-10-31 MED ORDER — ORPHENADRINE CITRATE 30 MG/ML IJ SOLN
INTRAMUSCULAR | Status: AC
  Filled 2015-10-31: qty 2

## 2015-10-31 MED ORDER — CIPROFLOXACIN HCL 250 MG PO TABS: 250.0000 mg | ORAL_TABLET | Freq: Two times a day (BID) | ORAL | Status: DC

## 2015-10-31 MED ORDER — MIDAZOLAM HCL 5 MG/5ML IJ SOLN: 5.0000 mg | Freq: Once | INTRAMUSCULAR | Status: DC

## 2015-10-31 MED ORDER — BUPIVACAINE HCL (PF) 0.25 % IJ SOLN: 30.0000 mL | Freq: Once | INTRAMUSCULAR | Status: DC

## 2015-10-31 NOTE — Patient Instructions (Addendum)
PLAN   Continue present medications and please obtain Cipro antibiotic today and begin taking Cipro antibiotic as prescribed. Prior medications have included Neurontin (gabapentin) meloxicam and hydrocodone acetaminophen(05/325mg ) . Patient not taking these medications at this time   F/U PCP Dr.Lithavong  for evaliation of  BP and general medical  condition  F/U surgical evaluation. May consider pending follow-up evaluations  F/U neurological evaluation. May consider PNCV/EMG studies and other studies pending follow-up evaluations  May consider radiofrequency rhizolysis or intraspinal procedures pending response to present treatment and F/U evaluation   Patient to call Pain Management Center should patient have concerns prior to scheduled return appointment. Pain Management Discharge Instructions  General Discharge Instructions :  If you need to reach your doctor call: Monday-Friday 8:00 am - 4:00 pm at [phone] or toll free [phone].  After clinic hours [phone] to have operator reach doctor.  Bring all of your medication bottles to all your appointments in the pain clinic.  To cancel or reschedule your appointment with Pain Management please remember to call 24 hours in advance to avoid a fee.  Refer to the educational materials which you have been given on: General Risks, I had my Procedure. Discharge Instructions, Post Sedation.  Post Procedure Instructions:  The drugs you were given will stay in your system until tomorrow, so for the next 24 hours you should not drive, make any legal decisions or drink any alcoholic beverages.  You may eat anything you prefer, but it is better to start with liquids then soups and crackers, and gradually work up to solid foods.  Please notify your doctor immediately if you have any unusual bleeding, trouble breathing or pain that is not related to your normal pain.  Depending on the type of procedure that was done, some parts of your  body may feel week and/or numb.  This usually clears up by tonight or the next day.  Walk with the use of an assistive device or accompanied by an adult for the 24 hours.  You may use ice on the affected area for the first 24 hours.  Put ice in a Ziploc bag and cover with a towel and place against area 15 minutes on 15 minutes off.  You may switch to heat after 24 hours.

## 2015-10-31 NOTE — Progress Notes (Signed)
Safety precautions to be maintained throughout the outpatient stay will include: orient to surroundings, keep bed in low position, maintain call bell within reach at all times, provide assistance with transfer out of bed and ambulation.  

## 2015-10-31 NOTE — Progress Notes (Signed)
Subjective:    Patient ID: Lorraine Rose, female    DOB: 19-Jan-1966, 50 y.o.   MRN: [id number]  HPI  PROCEDURE PERFORMED: Lumbar facet (medial branch block)   NOTE: The patient is a 50 y.o. female who returns to [facility] for further evaluation and treatment of pain involving the lumbar and lower extremity region. MRI  revealed the patient to be with evidence of degenerative disc disease lumbar spine T12-L1: No significant disc bulge. No evidence of neural foraminal  stenosis. No central canal stenosis.   L1-L2: No significant disc bulge. No evidence of neural foraminal  stenosis. No central canal stenosis.   L2-L3: No significant disc bulge. No evidence of neural foraminal  stenosis. No central canal stenosis.   L3-L4: No significant disc bulge. No evidence of neural foraminal  stenosis. No central canal stenosis.   L4-L5: No significant disc bulge. No evidence of neural foraminal  stenosis. No central canal stenosis. Mild bilateral facet  arthropathy.   L5-S1: Posterior spinal fusion. Left paracentral disc osteophyte  complex abutting the left intraspinal S1 nerve root. No evidence of  neural foraminal stenosis. No central canal stenosis.   IMPRESSION:  1. At L5-S1 there is posterior spinal fusion. There is a left  paracentral disc osteophyte complex abutting the left intraspinal S1  nerve root.  . There is concern regarding significant component of patient's pain being due to lumbar facet syndrome. The risks, benefits, and expectations of the procedure have been discussed and explained to the patient who was understanding and in agreement with suggested treatment plan. We will proceed with interventional treatment as discussed and as explained to the patient who was understanding and wished to proceed with procedure as planned.   DESCRIPTION OF PROCEDURE: Lumbar facet (medial branch block) with IV Versed, IV fentanyl conscious sedation,  EKG, blood pressure, pulse, and pulse oximetry monitoring. The procedure was performed with the patient in the prone position. Betadine prep of proposed entry site performed.   NEEDLE PLACEMENT AT: Left L 2 lumbar facet (medial branch block). Under fluoroscopic guidance with oblique orientation of 15 degrees, a 22-gauge needle was inserted at the L 2 vertebral body level with needle placed at the targeted area of Burton's Eye or Eye of the Scotty Dog with documentation of needle placement in the superior and lateral border of targeted area of Burton's Eye or Eye of the Scotty Dog with oblique orientation of 15 degrees. Following documentation of needle placement at the L 2 vertebral body level, needle placement was then accomplished at the L 3 vertebral body level.   NEEDLE PLACEMENT AT L3, L4, and L5 VERTEBRAL BODY LEVELS ON THE LEFT SIDE The procedure was performed at the L3, L4, and L5 vertebral body levels exactly as was performed at the L 2 vertebral body level utilizing the same technique and under fluoroscopic guidance.  NEEDLE PLACEMENT AT THE SACRAL ALA with AP view of the lumbosacral spine. With the patient in the prone position, Betadine prep of proposed entry site accomplished, a 22 gauge needle was inserted in the region of the sacral ala (groove formed by the superior articulating process of S1 and the sacral wing). Following documentation of needle placement at the sacral ala,    Needle placement was then verified at all levels on lateral view. Following documentation of needle placement at all levels on lateral view and following negative aspiration for heme and CSF, each level was injected with 1 mL of 0.25% bupivacaine with Kenalog  LUMBAR FACET, MEDIAL BRANCH NERVE, BLOCKS PERFORMED ON THE RIGHT SIDE   The procedure was performed on the right side exactly as was performed on the left side at the same levels and utilizing the same technique under fluoroscopic  guidance.    Myoneural block injections of the gluteal musculature region Following Betadine prep of proposed entry site a 22-gauge needle was inserted in the gluteal musculature region and following negative aspiration 1 cc of 0.25% bupivacaine was injected into the gluteal musculature region times two.     The patient tolerated the procedure well.   A total of 40 mg of Kenalog was utilized for the procedure.   PLAN:  1. Medications: The patient will continue presently prescribed medications. 2. May consider modification of treatment regimen at time of return appointment pending response to treatment rendered on today's visit. 3. The patient is to follow-up with primary care physician [facility]   for further evaluation of blood pressure and general medical condition status post steroid injection performed on today's visit. 4. Surgical follow-up evaluation.Has been addressed  5. Neurological follow-up evaluation.May consider PNCV EMG studies and other studies  6. The patient may be candidate for radiofrequency procedures, implantation type procedures, and other treatment pending response to treatment and follow-up evaluation. 7. The patient has been advised to call the [facility] prior to scheduled return appointment should there be significant change in condition or should patient have other concerns regarding condition prior to scheduled return appointment.  The patient is understanding and in agreement with suggested treatment plan.   Review of Systems     Objective:   Physical Exam        Assessment & Plan:

## 2015-11-01 ENCOUNTER — Telehealth: Payer: Self-pay | Admitting: *Deleted

## 2015-11-01 ENCOUNTER — Telehealth: Payer: Self-pay

## 2015-11-01 NOTE — Telephone Encounter (Signed)
Spoke with patient and she explains that she is having a great deal of pain in her R hip.  Patient had a facet block on yesterday and states that when she left our office she was not having pain and pain started back around 6pm  Last evening.  Patient states that she has been using ice and is taking ibuprofen and tylenol for discomfort.  Advised that these s/s sound normal and that it will take time for the steroids to do their job and what she was doing was correct.  She also asked why Dr Primus Bravo would not prescribe pain medication?  I did tell her that a letter had been faxed to Dr Sharlet Salina for permission to prescribe and one proactive thing she could do would be to call that office to make sure they had received that and would send permission to give medications.  Also, told her that she could discuss all of this with Dr Primus Bravo at her next visit.  Patient is agreeable to this.  I told patient that if she became worse or things did not get better to call our office back.  Patient stated that she would go to the ED.  I told her that was an option.  Also, told her everything she was describing sounded normal postprocedure and that it is possible that with time pain would improve after steroids begin to work.  Patient verbalizes u/o information given.

## 2015-11-01 NOTE — Telephone Encounter (Signed)
Spoke with patients husband, number I have for Lorraine Rose is not working.  He states that is her number and in fact she has tried to call us this morning d/t her pain and discomfort that she is having after procedure.  He asked if I would try her back and that possibly she was on the other line and this is why the call may not be going through.

## 2015-11-02 ENCOUNTER — Other Ambulatory Visit: Payer: Self-pay | Admitting: Pain Medicine

## 2015-11-02 NOTE — Telephone Encounter (Signed)
Hip pain increased since procedure, can hardly walk

## 2015-11-20 ENCOUNTER — Encounter: Payer: Self-pay | Admitting: Pain Medicine

## 2015-11-20 ENCOUNTER — Ambulatory Visit: Payer: Medicare Other | Attending: Pain Medicine | Admitting: Pain Medicine

## 2015-11-20 VITALS — BP 127/84 | HR 90 | Temp 98.3°F | Resp 16 | Ht 66.0 in | Wt 140.0 lb

## 2015-11-20 DIAGNOSIS — M5116 Intervertebral disc disorders with radiculopathy, lumbar region: Secondary | ICD-10-CM | POA: Insufficient documentation

## 2015-11-20 DIAGNOSIS — M79604 Pain in right leg: Secondary | ICD-10-CM | POA: Diagnosis present

## 2015-11-20 DIAGNOSIS — M79605 Pain in left leg: Secondary | ICD-10-CM | POA: Diagnosis present

## 2015-11-20 DIAGNOSIS — M533 Sacrococcygeal disorders, not elsewhere classified: Secondary | ICD-10-CM | POA: Diagnosis not present

## 2015-11-20 DIAGNOSIS — M2578 Osteophyte, vertebrae: Secondary | ICD-10-CM | POA: Diagnosis not present

## 2015-11-20 DIAGNOSIS — M47816 Spondylosis without myelopathy or radiculopathy, lumbar region: Secondary | ICD-10-CM

## 2015-11-20 DIAGNOSIS — Z981 Arthrodesis status: Secondary | ICD-10-CM | POA: Diagnosis not present

## 2015-11-20 DIAGNOSIS — M5416 Radiculopathy, lumbar region: Secondary | ICD-10-CM

## 2015-11-20 DIAGNOSIS — Z9889 Other specified postprocedural states: Secondary | ICD-10-CM | POA: Diagnosis not present

## 2015-11-20 DIAGNOSIS — M545 Low back pain: Secondary | ICD-10-CM | POA: Diagnosis present

## 2015-11-20 DIAGNOSIS — M51369 Other intervertebral disc degeneration, lumbar region without mention of lumbar back pain or lower extremity pain: Secondary | ICD-10-CM

## 2015-11-20 DIAGNOSIS — M5136 Other intervertebral disc degeneration, lumbar region: Secondary | ICD-10-CM

## 2015-11-20 NOTE — Patient Instructions (Addendum)
PLAN   Continue present medications . Prior medications have included Neurontin (gabapentin) meloxicam and hydrocodone acetaminophen(05/325mg ) . Patient not taking these medications at this time   Block of nerves to the sacroiliac joint to be performed at time return appointment  F/U PCP Dr.Lithavong  for evaliation of  BP and general medical  condition  F/U surgical evaluation. May consider pending follow-up evaluations  Please repeat UDS today as discussed  F/U neurological evaluation. May consider PNCV/EMG studies and other studies pending follow-up evaluations  May consider radiofrequency rhizolysis or intraspinal procedures pending response to present treatment and F/U evaluation   Patient to call [facility] should patient have concerns prior to scheduled return appointment.Sacroiliac (SI) Joint Injection Patient Information  Description: The sacroiliac joint connects the scrum (very low back and tailbone) to the ilium (a pelvic bone which also forms half of the hip joint).  Normally this joint experiences very little motion.  When this joint becomes inflamed or unstable low back and or hip and pelvis pain may result.  Injection of this joint with local anesthetics (numbing medicines) and steroids can provide diagnostic information and reduce pain.  This injection is performed with the aid of x-ray guidance into the tailbone area while you are lying on your stomach.   You may experience an electrical sensation down the leg while this is being done.  You may also experience numbness.  We also may ask if we are reproducing your normal pain during the injection.  Conditions which may be treated SI injection:   Low back, buttock, hip or leg pain  Preparation for the Injection:  1. Do not eat any solid food or dairy products within 8 hours of your appointment.  2. You may drink clear liquids up to 3 hours before appointment.  Clear liquids include water, black coffee, juice  or soda.  No milk or cream please. 3. You may take your regular medications, including pain medications with a sip of water before your appointment.  Diabetics should hold regular insulin (if take separately) and take 1/2 normal NPH dose the morning of the procedure.  Carry some sugar containing items with you to your appointment. 4. A driver must accompany you and be prepared to drive you home after your procedure. 5. Bring all of your current medications with you. 6. An IV may be inserted and sedation may be given at the discretion of the physician. 7. A blood pressure cuff, EKG and other monitors will often be applied during the procedure.  Some patients may need to have extra oxygen administered for a short period.  8. You will be asked to provide medical information, including your allergies, prior to the procedure.  We must know immediately if you are taking blood thinners (like Coumadin/Warfarin) or if you are allergic to IV iodine contrast (dye).  We must know if you could possible be pregnant.  Possible side effects:   Bleeding from needle site  Infection (rare, may require surgery)  Nerve injury (rare)  Numbness & tingling (temporary)  A brief convulsion or seizure  Light-headedness (temporary)  Pain at injection site (several days)  Decreased blood pressure (temporary)  Weakness in the leg (temporary)   Call if you experience:   New onset weakness or numbness of an extremity below the injection site that last more than 8 hours.  Hives or difficulty breathing ( go to the emergency room)  Inflammation or drainage at the injection site  Any new symptoms which are concerning  to you  Please note:  Although the local anesthetic injected can often make your back/ hip/ buttock/ leg feel good for several hours after the injections, the pain will likely return.  It takes 3-7 days for steroids to work in the sacroiliac area.  You may not notice any pain relief for at least  that one week.  If effective, we will often do a series of three injections spaced 3-6 weeks apart to maximally decrease your pain.  After the initial series, we generally will wait some months before a repeat injection of the same type.  If you have any questions, please call [phone] [place]  What are the risk, side effects and possible complications? Generally speaking, most procedures are safe.  However, with any procedure there are risks, side effects, and the possibility of complications.  The risks and complications are dependent upon the sites that are lesioned, or the type of nerve block to be performed.  The closer the procedure is to the spine, the more serious the risks are.  Great care is taken when placing the radio frequency needles, block needles or lesioning probes, but sometimes complications can occur. 1. Infection: Any time there is an injection through the skin, there is a risk of infection.  This is why sterile conditions are used for these blocks.  There are four possible types of infection. 1. Localized skin infection. 2. Central Nervous System Infection-This can be in the form of Meningitis, which can be deadly. 3. Epidural Infections-This can be in the form of an epidural abscess, which can cause pressure inside of the spine, causing compression of the spinal cord with subsequent paralysis. This would require an emergency surgery to decompress, and there are no guarantees that the patient would recover from the paralysis. 4. Discitis-This is an infection of the intervertebral discs.  It occurs in about 1% of discography procedures.  It is difficult to treat and it may lead to surgery.        2. Pain: the needles have to go through skin and soft tissues, will cause soreness.       3. Damage to internal structures:  The nerves to be lesioned may be near blood vessels or    other nerves which can be  potentially damaged.       4. Bleeding: Bleeding is more common if the patient is taking blood thinners such as  aspirin, Coumadin, Ticiid, Plavix, etc., or if he/she have some genetic predisposition  such as hemophilia. Bleeding into the spinal canal can cause compression of the spinal  cord with subsequent paralysis.  This would require an emergency surgery to  decompress and there are no guarantees that the patient would recover from the  paralysis.       5. Pneumothorax:  Puncturing of a lung is a possibility, every time a needle is introduced in  the area of the chest or upper back.  Pneumothorax refers to free air around the  collapsed lung(s), inside of the thoracic cavity (chest cavity).  Another two possible  complications related to a similar event would include: Hemothorax and Chylothorax.   These are variations of the Pneumothorax, where instead of air around the collapsed  lung(s), you may have blood or chyle, respectively.       6. Spinal headaches: They may occur with any procedures in the area of the spine.       7. Persistent CSF (  Cerebro-Spinal Fluid) leakage: This is a rare problem, but may occur  with prolonged intrathecal or epidural catheters either due to the formation of a fistulous  track or a dural tear.       8. Nerve damage: By working so close to the spinal cord, there is always a possibility of  nerve damage, which could be as serious as a permanent spinal cord injury with  paralysis.       9. Death:  Although rare, severe deadly allergic reactions known as "Anaphylactic  reaction" can occur to any of the medications used.      10. Worsening of the symptoms:  We can always make thing worse.  What are the chances of something like this happening? Chances of any of this occuring are extremely low.  By statistics, you have more of a chance of getting killed in a motor vehicle accident: while driving to the hospital than any of the above occurring .  Nevertheless, you should be  aware that they are possibilities.  In general, it is similar to taking a shower.  Everybody knows that you can slip, hit your head and get killed.  Does that mean that you should not shower again?  Nevertheless always keep in mind that statistics do not mean anything if you happen to be on the wrong side of them.  Even if a procedure has a 1 (one) in a 1,000,000 (million) chance of going wrong, it you happen to be that one..Also, keep in mind that by statistics, you have more of a chance of having something go wrong when taking medications.  Who should not have this procedure? If you are on a blood thinning medication (e.g. Coumadin, Plavix, see list of "Blood Thinners"), or if you have an active infection going on, you should not have the procedure.  If you are taking any blood thinners, please inform your physician.  How should I prepare for this procedure?  Do not eat or drink anything at least six hours prior to the procedure.  Bring a driver with you .  It cannot be a taxi.  Come accompanied by an adult that can drive you back, and that is strong enough to help you if your legs get weak or numb from the local anesthetic.  Take all of your medicines the morning of the procedure with just enough water to swallow them.  If you have diabetes, make sure that you are scheduled to have your procedure done first thing in the morning, whenever possible.  If you have diabetes, take only half of your insulin dose and notify our nurse that you have done so as soon as you arrive at the clinic.  If you are diabetic, but only take blood sugar pills (oral hypoglycemic), then do not take them on the morning of your procedure.  You may take them after you have had the procedure.  Do not take aspirin or any aspirin-containing medications, at least eleven (11) days prior to the procedure.  They may prolong bleeding.  Wear loose fitting clothing that may be easy to take off and that you would not mind if it  got stained with Betadine or blood.  Do not wear any jewelry or perfume  Remove any nail coloring.  It will interfere with some of our monitoring equipment.  NOTE: Remember that this is not meant to be interpreted as a complete list of all possible complications.  Unforeseen problems may occur.  BLOOD THINNERS The following drugs  contain aspirin or other products, which can cause increased bleeding during surgery and should not be taken for 2 weeks prior to and 1 week after surgery.  If you should need take something for relief of minor pain, you may take acetaminophen which is found in Tylenol,m Datril, Anacin-3 and Panadol. It is not blood thinner. The products listed below are.  Do not take any of the products listed below in addition to any listed on your instruction sheet.  A.P.C or A.P.C with Codeine Codeine Phosphate Capsules #3 Ibuprofen Ridaura  ABC compound Congesprin Imuran rimadil  Advil Cope Indocin Robaxisal  Alka-Seltzer Effervescent Pain Reliever and Antacid Coricidin or Coricidin-D  Indomethacin Rufen  Alka-Seltzer plus Cold Medicine Cosprin Ketoprofen S-A-C Tablets  Anacin Analgesic Tablets or Capsules Coumadin Korlgesic Salflex  Anacin Extra Strength Analgesic tablets or capsules CP-2 Tablets Lanoril Salicylate  Anaprox Cuprimine Capsules Levenox Salocol  Anexsia-D Dalteparin Magan Salsalate  Anodynos Darvon compound Magnesium Salicylate Sine-off  Ansaid Dasin Capsules Magsal Sodium Salicylate  Anturane Depen Capsules Marnal Soma  APF Arthritis pain formula Dewitt's Pills Measurin Stanback  Argesic Dia-Gesic Meclofenamic Sulfinpyrazone  Arthritis Bayer Timed Release Aspirin Diclofenac Meclomen Sulindac  Arthritis pain formula Anacin Dicumarol Medipren Supac  Analgesic (Safety coated) Arthralgen Diffunasal Mefanamic Suprofen  Arthritis Strength Bufferin Dihydrocodeine Mepro Compound Suprol  Arthropan liquid Dopirydamole Methcarbomol with Aspirin Synalgos  ASA  tablets/Enseals Disalcid Micrainin Tagament  Ascriptin Doan's Midol Talwin  Ascriptin A/D Dolene Mobidin Tanderil  Ascriptin Extra Strength Dolobid Moblgesic Ticlid  Ascriptin with Codeine Doloprin or Doloprin with Codeine Momentum Tolectin  Asperbuf Duoprin Mono-gesic Trendar  Aspergum Duradyne Motrin or Motrin IB Triminicin  Aspirin plain, buffered or enteric coated Durasal Myochrisine Trigesic  Aspirin Suppositories Easprin Nalfon Trillsate  Aspirin with Codeine Ecotrin Regular or Extra Strength Naprosyn Uracel  Atromid-S Efficin Naproxen Ursinus  Auranofin Capsules Elmiron Neocylate Vanquish  Axotal Emagrin Norgesic Verin  Azathioprine Empirin or Empirin with Codeine Normiflo Vitamin E  Azolid Emprazil Nuprin Voltaren  Bayer Aspirin plain, buffered or children's or timed BC Tablets or powders Encaprin Orgaran Warfarin Sodium  Buff-a-Comp Enoxaparin Orudis Zorpin  Buff-a-Comp with Codeine Equegesic Os-Cal-Gesic   Buffaprin Excedrin plain, buffered or Extra Strength Oxalid   Bufferin Arthritis Strength Feldene Oxphenbutazone   Bufferin plain or Extra Strength Feldene Capsules Oxycodone with Aspirin   Bufferin with Codeine Fenoprofen Fenoprofen Pabalate or Pabalate-SF   Buffets II Flogesic Panagesic   Buffinol plain or Extra Strength Florinal or Florinal with Codeine Panwarfarin   Buf-Tabs Flurbiprofen Penicillamine   Butalbital Compound Four-way cold tablets Penicillin   Butazolidin Fragmin Pepto-Bismol   Carbenicillin Geminisyn Percodan   Carna Arthritis Reliever Geopen Persantine   Carprofen Gold's salt Persistin   Chloramphenicol Goody's Phenylbutazone   Chloromycetin Haltrain Piroxlcam   Clmetidine heparin Plaquenil   Cllnoril Hyco-pap Ponstel   Clofibrate Hydroxy chloroquine Propoxyphen         Before stopping any of these medications, be sure to consult the physician who ordered them.  Some, such as Coumadin (Warfarin) are ordered to prevent or treat serious conditions  such as "deep thrombosis", "pumonary embolisms", and other heart problems.  The amount of time that you may need off of the medication may also vary with the medication and the reason for which you were taking it.  If you are taking any of these medications, please make sure you notify your pain physician before you undergo any procedures.

## 2015-11-20 NOTE — Progress Notes (Signed)
Safety precautions to be maintained throughout the outpatient stay will include: orient to surroundings, keep bed in low position, maintain call bell within reach at all times, provide assistance with transfer out of bed and ambulation.  

## 2015-11-20 NOTE — Progress Notes (Signed)
Subjective:    Patient ID: Lorraine Rose, female    DOB: 03-14-1966, 50 y.o.   MRN: [id number]  HPI  The patient is a 50 year old female who returns to pain management for further evaluation and treatment of pain involving the lower back and lower extremity regions. The patient states that she continues to have significant pain. The patient is status post lumbar facet, medial branch nerve blocks. The patient states that the pain radiation the lumbar region toward the buttocks on the left as well as on the right. On today's visit we reviewed patient's urine drug screen was positive for alcohol. We informed patient that we will be unable to prescribe any medications at this time. We informed patient that we would like to repeat urine drug screen and will consider prescribing medications pending follow-up evaluations including review of the urine drug screen. We will remain available to consider modification of treatment regimen as discussed with patient. At the present time patient appeared to be with pain due to sacroiliac joint involvement with severe increased pain with palpation over the PSIS and PII S region. Incision was made to proceed with scheduling patient for block of nerves to the sacroiliac joint to be performed at time of return appointment. Once again we informed patient that we or on able to promise that we will Prescribe medications for treatment of her pain. We informed patient that we would consider referring her to a tertiary pain clinic if we were unable to provide her with significant relief of her pain or if she decided to do such. We will proceed with interventional treatment consisting of block of nerves to the sacroiliac joint on today's visit and we'll consider additional modifications of treatment regimen pending follow-up evaluation.      Review of Systems     Objective:   Physical Exam  There was tenderness of the splenius capitis and occipitalis region a mild degree  with mild tenderness of the cervical facet cervical paraspinal musculature region. Palpation over the region of the thoracic facet thoracic paraspinal musculatures and was with mild tenderness to palpation of moderate tenderness to palpation with moderate tends to palpation over the lower thoracic paraspinal musculature region associated with moderate muscle spasm. Palpation of the acromioclavicular and glenohumeral joint regions were with tends to palpation of mild degree with unremarkable Spurling's maneuver. Tinel and Phalen's maneuver were without increased pain of significant degree and patient appeared to be with bilaterally equal grip strength. Palpation over the lumbar paraspinal musculatures and lumbar facet region was with moderate to moderately severe discomfort with lateral bending rotation extension and palpation over the lumbar facets reproducing moderate to moderately severe discomfort. Straight leg raising was tolerates approximately 30 without a definite increased pain with dorsiflexion noted. EHL strength appeared to be decreased. No definite sensory deficit or dermatomal distribution detected. Palpation over the PSIS and PII S region reproduces moderate discomfort with mild tenderness of the greater trochanteric region and iliotibial band region. There was negative clonus negative Homans. Abdomen was nontender and no costovertebral tenderness was noted       Assessment & Plan:       Degenerative disc disease lumbar spine T12-L1: No significant disc bulge. No evidence of neural foraminal  stenosis. No central canal stenosis.   L1-L2: No significant disc bulge. No evidence of neural foraminal  stenosis. No central canal stenosis.   L2-L3: No significant disc bulge. No evidence of neural foraminal  stenosis. No central canal stenosis.   L3-L4:  No significant disc bulge. No evidence of neural foraminal  stenosis. No central canal stenosis.   L4-L5: No significant  disc bulge. No evidence of neural foraminal  stenosis. No central canal stenosis. Mild bilateral facet  arthropathy.   L5-S1: Posterior spinal fusion. Left paracentral disc osteophyte  complex abutting the left intraspinal S1 nerve root. No evidence of  neural foraminal stenosis. No central canal stenosis.   IMPRESSION:  1. At L5-S1 there is posterior spinal fusion. There is a left  paracentral disc osteophyte complex abutting the left intraspinal S1  nerve root.   Status post lumbar surgery 2 (lumbar laminectomy and lumbar fusion)  Lumbar facet syndrome  Lumbar radiculopathy  Sacroiliac joint dysfunction      PLAN   Continue present medications . Prior medications have included Neurontin (gabapentin) meloxicam and hydrocodone acetaminophen(05/325mg ) . Patient not taking these medications at this time   Block of nerves to the sacroiliac joint to be performed at time return appointment  F/U PCP Dr.Lithavong  for evaliation of  BP and general medical  condition  F/U surgical evaluation. May consider pending follow-up evaluations  Please repeat UDS today as discussed  F/U neurological evaluation. May consider PNCV/EMG studies and other studies pending follow-up evaluations  May consider radiofrequency rhizolysis or intraspinal procedures pending response to present treatment and F/U evaluation   Patient to call [facility] should patient have concerns prior to scheduled return appointment.

## 2015-11-24 LAB — TOXASSURE SELECT 13 (MW), URINE: PDF: 0

## 2015-12-03 ENCOUNTER — Encounter: Payer: Self-pay | Admitting: Pain Medicine

## 2015-12-03 ENCOUNTER — Ambulatory Visit: Payer: Medicare Other | Attending: Pain Medicine | Admitting: Pain Medicine

## 2015-12-03 VITALS — BP 124/73 | HR 91 | Temp 98.6°F | Resp 18 | Ht 66.0 in | Wt 140.0 lb

## 2015-12-03 DIAGNOSIS — Z981 Arthrodesis status: Secondary | ICD-10-CM | POA: Insufficient documentation

## 2015-12-03 DIAGNOSIS — M47816 Spondylosis without myelopathy or radiculopathy, lumbar region: Secondary | ICD-10-CM

## 2015-12-03 DIAGNOSIS — M79606 Pain in leg, unspecified: Secondary | ICD-10-CM | POA: Diagnosis present

## 2015-12-03 DIAGNOSIS — M2578 Osteophyte, vertebrae: Secondary | ICD-10-CM | POA: Insufficient documentation

## 2015-12-03 DIAGNOSIS — M5416 Radiculopathy, lumbar region: Secondary | ICD-10-CM

## 2015-12-03 DIAGNOSIS — M5136 Other intervertebral disc degeneration, lumbar region: Secondary | ICD-10-CM | POA: Insufficient documentation

## 2015-12-03 DIAGNOSIS — M545 Low back pain: Secondary | ICD-10-CM | POA: Diagnosis present

## 2015-12-03 DIAGNOSIS — M533 Sacrococcygeal disorders, not elsewhere classified: Secondary | ICD-10-CM

## 2015-12-03 DIAGNOSIS — Z9889 Other specified postprocedural states: Secondary | ICD-10-CM

## 2015-12-03 DIAGNOSIS — M1288 Other specific arthropathies, not elsewhere classified, other specified site: Secondary | ICD-10-CM | POA: Diagnosis not present

## 2015-12-03 MED ORDER — CEFAZOLIN SODIUM 1 G IJ SOLR
INTRAMUSCULAR | Status: AC
  Administered 2015-12-03: 11:00:00
  Filled 2015-12-03: qty 10

## 2015-12-03 MED ORDER — CIPROFLOXACIN IN D5W 400 MG/200ML IV SOLN: 400.0000 mg | Freq: Once | INTRAVENOUS | Status: DC

## 2015-12-03 MED ORDER — BUPIVACAINE HCL (PF) 0.25 % IJ SOLN
INTRAMUSCULAR | Status: AC
  Administered 2015-12-03: 11:00:00
  Filled 2015-12-03: qty 30

## 2015-12-03 MED ORDER — MIDAZOLAM HCL 5 MG/5ML IJ SOLN
INTRAMUSCULAR | Status: AC
  Administered 2015-12-03: 5 mg via INTRAVENOUS
  Filled 2015-12-03: qty 5

## 2015-12-03 MED ORDER — LACTATED RINGERS IV SOLN: 1000.0000 mL | INTRAVENOUS | Status: DC

## 2015-12-03 MED ORDER — FENTANYL CITRATE (PF) 100 MCG/2ML IJ SOLN: 100.0000 ug | Freq: Once | INTRAMUSCULAR | Status: DC

## 2015-12-03 MED ORDER — CIPROFLOXACIN HCL 250 MG PO TABS
250.0000 mg | ORAL_TABLET | Freq: Two times a day (BID) | ORAL | Status: DC
  Filled 2015-12-03: qty 1

## 2015-12-03 MED ORDER — MIDAZOLAM HCL 5 MG/5ML IJ SOLN: 5.0000 mg | Freq: Once | INTRAMUSCULAR | Status: DC

## 2015-12-03 MED ORDER — BUPIVACAINE HCL (PF) 0.25 % IJ SOLN: 30.0000 mL | Freq: Once | INTRAMUSCULAR | Status: DC

## 2015-12-03 MED ORDER — ORPHENADRINE CITRATE 30 MG/ML IJ SOLN
INTRAMUSCULAR | Status: AC
  Administered 2015-12-03: 11:00:00
  Filled 2015-12-03: qty 2

## 2015-12-03 MED ORDER — CEFAZOLIN SODIUM 1-5 GM-% IV SOLN: 1.0000 g | Freq: Once | INTRAVENOUS | Status: DC

## 2015-12-03 MED ORDER — TRIAMCINOLONE ACETONIDE 40 MG/ML IJ SUSP: 40.0000 mg | Freq: Once | INTRAMUSCULAR | Status: DC

## 2015-12-03 MED ORDER — FENTANYL CITRATE (PF) 100 MCG/2ML IJ SOLN
INTRAMUSCULAR | Status: AC
  Administered 2015-12-03: 100 ug via INTRAVENOUS
  Filled 2015-12-03: qty 2

## 2015-12-03 MED ORDER — CIPROFLOXACIN IN D5W 400 MG/200ML IV SOLN
INTRAVENOUS | Status: AC
  Administered 2015-12-03: 11:00:00
  Filled 2015-12-03: qty 200

## 2015-12-03 MED ORDER — CIPROFLOXACIN IN D5W 400 MG/200ML IV SOLN
INTRAVENOUS | Status: AC
  Filled 2015-12-03: qty 200

## 2015-12-03 MED ORDER — CIPROFLOXACIN IN D5W 400 MG/200ML IV SOLN
400.0000 mg | Freq: Once | INTRAVENOUS | Status: DC
Start: 2015-12-03 — End: 2016-05-05

## 2015-12-03 MED ORDER — CEFUROXIME AXETIL 250 MG PO TABS: 250.0000 mg | ORAL_TABLET | Freq: Two times a day (BID) | ORAL | Status: DC

## 2015-12-03 MED ORDER — ORPHENADRINE CITRATE 30 MG/ML IJ SOLN: 60.0000 mg | Freq: Once | INTRAMUSCULAR | Status: DC

## 2015-12-03 MED ORDER — TRIAMCINOLONE ACETONIDE 40 MG/ML IJ SUSP
INTRAMUSCULAR | Status: AC
  Administered 2015-12-03: 11:00:00
  Filled 2015-12-03: qty 1

## 2015-12-03 NOTE — Progress Notes (Signed)
Subjective:    Patient ID: Lorraine Rose, female    DOB: June 23, 1966, 50 y.o.   MRN: [id number]  HPI  PROCEDURE:  Block of nerves to the sacroiliac joint.   NOTE:  The patient is a 50 y.o. female who returns to the [facility] for further evaluation and treatment of pain involving the lower back and lower extremity region with pain in the region of the buttocks as well. Prior MRI studies reveal    Degenerative disc disease lumbar spine T12-L1: No significant disc bulge. No evidence of neural foraminal  stenosis. No central canal stenosis.   L1-L2: No significant disc bulge. No evidence of neural foraminal  stenosis. No central canal stenosis.   L2-L3: No significant disc bulge. No evidence of neural foraminal  stenosis. No central canal stenosis.   L3-L4: No significant disc bulge. No evidence of neural foraminal  stenosis. No central canal stenosis.   L4-L5: No significant disc bulge. No evidence of neural foraminal  stenosis. No central canal stenosis. Mild bilateral facet  arthropathy.   L5-S1: Posterior spinal fusion. Left paracentral disc osteophyte  complex abutting the left intraspinal S1 nerve root. No evidence of  neural foraminal stenosis. No central canal stenosis.   IMPRESSION:  1. At L5-S1 there is posterior spinal fusion. There is a left  paracentral disc osteophyte complex abutting the left intraspinal S1  nerve root. . The patient is with reproduction of severe pain with palpation over the PSIS and PII S regions.   There is concern regarding a significant component of the patient's pain being due to sacroiliac joint dysfunction The risks, benefits, expectations of the procedure have been discussed and explained to the patient who is understanding and willing to proceed with interventional treatment in attempt to decrease severity of patient's symptoms, minimize the risk of medication escalation and  hopefully retard the  progression of the patient's symptoms. We will proceed with what is felt to be a medically necessary procedure, block of nerves to the sacroiliac joint.   DESCRIPTION OF PROCEDURE:  Block of nerves to the sacroiliac joint.   The patient was taken to the fluoroscopy suite. With the patient in the prone position with EKG, blood pressure, pulse, capnography, and pulse oximetry monitoring, IV Versed, IV fentanyl conscious sedation, Betadine prep of proposed entry site was performed.   Block of nerves at the L5 vertebral body level.   With the patient in prone position, under fluoroscopic guidance, a 22 -gauge needle was inserted at the L5 vertebral body level on the left side. With 15 degrees oblique orientation a 22 -gauge needle was inserted in the region known as Burton's eye or eye of the Scotty dog. Following documentation of needle placement in the area of Burton's eye or eye of the Scotty dog under fluoroscopic guidance, needle placement was then accomplished at the sacral ala level on the left side.   Needle placement at the sacral ala.   With the patient in prone position under fluoroscopic guidance with AP view of the lumbosacral spine, a 22 -gauge needle was inserted in the region known as the sacral ala on the left side. Following documentation of needle placement on the left side under fluoroscopic guidance needle placement was then accomplished at the S1 foramen level.   Needle placement at the S1 foramen level.   With the patient in prone position under fluoroscopic guidance with AP view of the lumbosacral spine and cephalad orientation, a 22 -gauge needle was inserted  at the superior and lateral border of the S1 foramen on the left side. Following documentation of needle placement at the S1 foramen level on the left side, needle placement was then accomplished at the S2 foramen level on the left side.   Needle placement at the S2 foramen level.   With the patient in prone position  with AP view of the lumbosacral spine with cephalad orientation, a 22 - gauge needle was inserted at the superior and lateral border of the S2 foramen under fluoroscopic guidance on the left side. Following needle placement at the L5 vertebral body level, sacral ala, S1 foramen and S2 foramen on the left side, needle placement was verified on lateral view under fluoroscopic guidance.  Following needle placement documentation on lateral view, each needle was injected with 1 mL of 0.25% bupivacaine and Kenalog.   BLOCK OF THE NERVES TO SACROILIAC JOINT ON THE RIGHT SIDE The procedure was performed on the right side at the same levels as was performed on the left side and utilizing the same technique as on the left side and was performed under fluoroscopic guidance as on the left side   A total of 10mg  of Kenalog was utilized for the procedure.   PLAN:  1. Medications: The patient will continue presently prescribed medications.  2. The patient will be considered for modification of treatment regimen pending response to the procedure performed on today's visit.  3. The patient is to follow-up with primary care physician Dr. Richarda Overlie for evaluation of blood pressure and general medical condition following the procedure performed on today's visit.  4. Surgical evaluation as discussed.  5. Neurological evaluation as discussed.  6. The patient may be a candidate for radiofrequency procedures, implantation devices and other treatment pending response to treatment performed on today's visit and follow-up evaluation.  7. The patient has been advised to adhere to proper body mechanics and to avoid activities which may exacerbate the patient's symptoms.   Return appointment to [facility] as scheduled.    Review of Systems     Objective:   Physical Exam        Assessment & Plan:

## 2015-12-03 NOTE — Patient Instructions (Signed)
PLAN   Continue present medications and please obtain Cipro antibiotic today and begin taking Cipro antibiotic as prescribed. Prior medications have included Neurontin (gabapentin) meloxicam and hydrocodone acetaminophen(05/325mg ) . Patient not taking these medications at this time   F/U PCP Dr.Lithavong  for evaliation of  BP and general medical  condition  F/U surgical evaluation. May consider further surgical evaluation pending follow-up evaluations  F/U neurological evaluation. May consider PNCV/EMG studies and other studies pending follow-up evaluations  May consider radiofrequency rhizolysis or intraspinal procedures pending response to present treatment and F/U evaluation   Patient to call [facility] should patient have concerns prior to scheduled return appointment.

## 2015-12-03 NOTE — Progress Notes (Signed)
Patient here today for procedure visit.   Safety precautions to be maintained throughout the outpatient stay will include: orient to surroundings, keep bed in low position, maintain call bell within reach at all times, provide assistance with transfer out of bed and ambulation.  

## 2015-12-04 ENCOUNTER — Telehealth: Payer: Self-pay | Admitting: *Deleted

## 2015-12-04 NOTE — Telephone Encounter (Signed)
No problems post procedure. 

## 2015-12-11 ENCOUNTER — Encounter: Payer: Self-pay | Admitting: Pain Medicine

## 2015-12-11 ENCOUNTER — Ambulatory Visit: Payer: Medicare Other | Attending: Pain Medicine | Admitting: Pain Medicine

## 2015-12-11 VITALS — BP 125/84 | HR 80 | Temp 98.7°F | Resp 18 | Ht 66.0 in | Wt 145.0 lb

## 2015-12-11 DIAGNOSIS — Z981 Arthrodesis status: Secondary | ICD-10-CM | POA: Insufficient documentation

## 2015-12-11 DIAGNOSIS — M47816 Spondylosis without myelopathy or radiculopathy, lumbar region: Secondary | ICD-10-CM

## 2015-12-11 DIAGNOSIS — M5416 Radiculopathy, lumbar region: Secondary | ICD-10-CM

## 2015-12-11 DIAGNOSIS — M533 Sacrococcygeal disorders, not elsewhere classified: Secondary | ICD-10-CM | POA: Diagnosis not present

## 2015-12-11 DIAGNOSIS — M5116 Intervertebral disc disorders with radiculopathy, lumbar region: Secondary | ICD-10-CM | POA: Insufficient documentation

## 2015-12-11 DIAGNOSIS — Z9889 Other specified postprocedural states: Secondary | ICD-10-CM | POA: Diagnosis not present

## 2015-12-11 DIAGNOSIS — M5136 Other intervertebral disc degeneration, lumbar region: Secondary | ICD-10-CM

## 2015-12-11 DIAGNOSIS — M2578 Osteophyte, vertebrae: Secondary | ICD-10-CM | POA: Diagnosis not present

## 2015-12-11 MED ORDER — GABAPENTIN 300 MG PO CAPS: ORAL_CAPSULE | ORAL | Status: DC

## 2015-12-11 MED ORDER — HYDROCODONE-ACETAMINOPHEN 5-325 MG PO TABS: ORAL_TABLET | ORAL | Status: DC

## 2015-12-11 NOTE — Progress Notes (Signed)
Subjective:    Patient ID: Lorraine Rose, female    DOB: 14-Dec-1965, 50 y.o.   MRN: [id number]  HPI  The patient is a 50 year old female who returns to pain management for further evaluation and treatment of pain involving the lower back and lower extremity region predominantly the patient is status post prior surgical intervention of the lumbar region is without plans for additional surgery at this time. The patient underwent block of nerves to the sacroiliac joint injection with some improvement of her pain. We discussed patient's condition and will proceed with block of nerves to the sacroiliac joint at time of return appointment in attempt to decrease severity of patient's symptoms, minimize progression of symptoms, and avoid the need for more involved treatment. The patient was in agreement with suggested treatment plan. Decision has also made to prescribe medications patient on today's visit consisting of Neurontin and oxycodone acetaminophen. The patient was cautioned regarding respiratory depression, excessive sedation, confusion and other side effects which can occur with these medications. The patient was with understanding and in agreement with suggested treatment plan. She will block of nerves to the sacroiliac joint at time return appointment. All were in agreement with suggested treatment plan.      Review of Systems     Objective:   Physical Exam  There was tenderness to palpation of paraspinal must reason cervical region cervical facet region a mild degree with mild tenderness of the splenius capitis and occipitalis must chew regions. Palpation of the acromioclavicular and glenohumeral joint regions reproduce mild discomfort. The patient appeared to be unremarkable Spurling's maneuver. Tinel and Phalen's maneuver were without increase of pain of significant degree and patient appeared to be with bilaterally equal grip strength. There was no crepitus of the thoracic region noted.  There was tends to palpation of the lower thoracic paraspinal musculature region with evidence of moderate muscle spasms. Palpation over the lumbar paraspinal muscular region lumbar facet region was attends to palpation of moderate degree with moderate to moderately severe tenderness to palpation over the PSIS and PII S region. The patient appeared to be with increased pain with Patrick's maneuver as well. Palpation of the greater trochanteric region iliotibial band region was attends to palpation of mild to moderate degree. No sensory deficit or dermatomal distribution of the lower extremities was detected Straight leg raise was tolerates approximately 20 without increased pain with dorsiflexion noted. DTRs were difficult to elicit appeared to be trace at the knees. There was negative clonus negative Homans. Abdomen was nontender with no costovertebral tenderness noted      Assessment & Plan:       Sacroiliac joint dysfunction  Degenerative disc disease lumbar spine T12-L1: No significant disc bulge. No evidence of neural foraminal  stenosis. No central canal stenosis.   L1-L2: No significant disc bulge. No evidence of neural foraminal  stenosis. No central canal stenosis.   L2-L3: No significant disc bulge. No evidence of neural foraminal  stenosis. No central canal stenosis.   L3-L4: No significant disc bulge. No evidence of neural foraminal  stenosis. No central canal stenosis.   L4-L5: No significant disc bulge. No evidence of neural foraminal  stenosis. No central canal stenosis. Mild bilateral facet  arthropathy.   L5-S1: Posterior spinal fusion. Left paracentral disc osteophyte  complex abutting the left intraspinal S1 nerve root. No evidence of  neural foraminal stenosis. No central canal stenosis.   IMPRESSION:  1. At L5-S1 there is posterior spinal fusion. There is a  left  paracentral disc osteophyte complex abutting the left intraspinal S1  nerve  root.   Status post lumbar surgery 2 (lumbar laminectomy and lumbar fusion)  Lumbar facet syndrome  Lumbar radiculopathy     PLAN   Continue present medications . Prior medications have included Neurontin (gabapentin) meloxicam and hydrocodone acetaminophen(05/325mg ) . Patient not taking these medications at this time  . Resume hydrocodone acetaminophen and Neurontin today as discussed. These medications can cause respiratory depression, excessive sedation, confusion, and other side effects. Exercise extreme caution when taking these medications. Please go to emergency room if you develop any of these symptoms  Block of nerves to the sacroiliac joint to be performed at time return appointment  F/U PCP Dr.Lithavong  for evaliation of  BP and general medical  condition  F/U surgical evaluation. May consider pending follow-up evaluations  F/U neurological evaluation. May consider PNCV/EMG studies and other studies pending follow-up evaluations  May consider radiofrequency rhizolysis or intraspinal procedures pending response to present treatment and F/U evaluation   Patient to call [facility] should patient have concerns prior to scheduled return appointment

## 2015-12-11 NOTE — Patient Instructions (Addendum)
PLAN   Continue present medications . Prior medications have included Neurontin (gabapentin) meloxicam and hydrocodone acetaminophen(05/325mg ) . Patient not taking these medications at this time  . Resume hydrocodone acetaminophen and Neurontin today as discussed. These medications can cause respiratory depression, excessive sedation, confusion, and other side effects. Exercise extreme caution when taking these medications. Please go to emergency room if you develop any of these symptoms  Block of nerves to the sacroiliac joint to be performed at time return appointment  F/U PCP Dr.Lithavong  for evaliation of  BP and general medical  condition  F/U surgical evaluation. May consider pending follow-up evaluations  F/U neurological evaluation. May consider PNCV/EMG studies and other studies pending follow-up evaluations  May consider radiofrequency rhizolysis or intraspinal procedures pending response to present treatment and F/U evaluation   Patient to call [facility] should patient have concerns prior to scheduled return appointment.GENERAL RISKS AND COMPLICATIONS  What are the risk, side effects and possible complications? Generally speaking, most procedures are safe.  However, with any procedure there are risks, side effects, and the possibility of complications.  The risks and complications are dependent upon the sites that are lesioned, or the type of nerve block to be performed.  The closer the procedure is to the spine, the more serious the risks are.  Great care is taken when placing the radio frequency needles, block needles or lesioning probes, but sometimes complications can occur. 1. Infection: Any time there is an injection through the skin, there is a risk of infection.  This is why sterile conditions are used for these blocks.  There are four possible types of infection. 1. Localized skin infection. 2. Central Nervous System Infection-This can be in the form of  Meningitis, which can be deadly. 3. Epidural Infections-This can be in the form of an epidural abscess, which can cause pressure inside of the spine, causing compression of the spinal cord with subsequent paralysis. This would require an emergency surgery to decompress, and there are no guarantees that the patient would recover from the paralysis. 4. Discitis-This is an infection of the intervertebral discs.  It occurs in about 1% of discography procedures.  It is difficult to treat and it may lead to surgery.        2. Pain: the needles have to go through skin and soft tissues, will cause soreness.       3. Damage to internal structures:  The nerves to be lesioned may be near blood vessels or    other nerves which can be potentially damaged.       4. Bleeding: Bleeding is more common if the patient is taking blood thinners such as  aspirin, Coumadin, Ticiid, Plavix, etc., or if he/she have some genetic predisposition  such as hemophilia. Bleeding into the spinal canal can cause compression of the spinal  cord with subsequent paralysis.  This would require an emergency surgery to  decompress and there are no guarantees that the patient would recover from the  paralysis.       5. Pneumothorax:  Puncturing of a lung is a possibility, every time a needle is introduced in  the area of the chest or upper back.  Pneumothorax refers to free air around the  collapsed lung(s), inside of the thoracic cavity (chest cavity).  Another two possible  complications related to a similar event would include: Hemothorax and Chylothorax.   These are variations of the Pneumothorax, where instead of air around the collapsed  lung(s), you  may have blood or chyle, respectively.       6. Spinal headaches: They may occur with any procedures in the area of the spine.       7. Persistent CSF (Cerebro-Spinal Fluid) leakage: This is a rare problem, but may occur  with prolonged intrathecal or epidural catheters either due to the  formation of a fistulous  track or a dural tear.       8. Nerve damage: By working so close to the spinal cord, there is always a possibility of  nerve damage, which could be as serious as a permanent spinal cord injury with  paralysis.       9. Death:  Although rare, severe deadly allergic reactions known as "Anaphylactic  reaction" can occur to any of the medications used.      10. Worsening of the symptoms:  We can always make thing worse.  What are the chances of something like this happening? Chances of any of this occuring are extremely low.  By statistics, you have more of a chance of getting killed in a motor vehicle accident: while driving to the hospital than any of the above occurring .  Nevertheless, you should be aware that they are possibilities.  In general, it is similar to taking a shower.  Everybody knows that you can slip, hit your head and get killed.  Does that mean that you should not shower again?  Nevertheless always keep in mind that statistics do not mean anything if you happen to be on the wrong side of them.  Even if a procedure has a 1 (one) in a 1,000,000 (million) chance of going wrong, it you happen to be that one..Also, keep in mind that by statistics, you have more of a chance of having something go wrong when taking medications.  Who should not have this procedure? If you are on a blood thinning medication (e.g. Coumadin, Plavix, see list of "Blood Thinners"), or if you have an active infection going on, you should not have the procedure.  If you are taking any blood thinners, please inform your physician.  How should I prepare for this procedure?  Do not eat or drink anything at least six hours prior to the procedure.  Bring a driver with you .  It cannot be a taxi.  Come accompanied by an adult that can drive you back, and that is strong enough to help you if your legs get weak or numb from the local anesthetic.  Take all of your medicines the morning of the  procedure with just enough water to swallow them.  If you have diabetes, make sure that you are scheduled to have your procedure done first thing in the morning, whenever possible.  If you have diabetes, take only half of your insulin dose and notify our nurse that you have done so as soon as you arrive at the clinic.  If you are diabetic, but only take blood sugar pills (oral hypoglycemic), then do not take them on the morning of your procedure.  You may take them after you have had the procedure.  Do not take aspirin or any aspirin-containing medications, at least eleven (11) days prior to the procedure.  They may prolong bleeding.  Wear loose fitting clothing that may be easy to take off and that you would not mind if it got stained with Betadine or blood.  Do not wear any jewelry or perfume  Remove any nail coloring.  It will interfere  with some of our monitoring equipment.  NOTE: Remember that this is not meant to be interpreted as a complete list of all possible complications.  Unforeseen problems may occur.  BLOOD THINNERS The following drugs contain aspirin or other products, which can cause increased bleeding during surgery and should not be taken for 2 weeks prior to and 1 week after surgery.  If you should need take something for relief of minor pain, you may take acetaminophen which is found in Tylenol,m Datril, Anacin-3 and Panadol. It is not blood thinner. The products listed below are.  Do not take any of the products listed below in addition to any listed on your instruction sheet.  A.P.C or A.P.C with Codeine Codeine Phosphate Capsules #3 Ibuprofen Ridaura  ABC compound Congesprin Imuran rimadil  Advil Cope Indocin Robaxisal  Alka-Seltzer Effervescent Pain Reliever and Antacid Coricidin or Coricidin-D  Indomethacin Rufen  Alka-Seltzer plus Cold Medicine Cosprin Ketoprofen S-A-C Tablets  Anacin Analgesic Tablets or Capsules Coumadin Korlgesic Salflex  Anacin Extra Strength  Analgesic tablets or capsules CP-2 Tablets Lanoril Salicylate  Anaprox Cuprimine Capsules Levenox Salocol  Anexsia-D Dalteparin Magan Salsalate  Anodynos Darvon compound Magnesium Salicylate Sine-off  Ansaid Dasin Capsules Magsal Sodium Salicylate  Anturane Depen Capsules Marnal Soma  APF Arthritis pain formula Dewitt's Pills Measurin Stanback  Argesic Dia-Gesic Meclofenamic Sulfinpyrazone  Arthritis Bayer Timed Release Aspirin Diclofenac Meclomen Sulindac  Arthritis pain formula Anacin Dicumarol Medipren Supac  Analgesic (Safety coated) Arthralgen Diffunasal Mefanamic Suprofen  Arthritis Strength Bufferin Dihydrocodeine Mepro Compound Suprol  Arthropan liquid Dopirydamole Methcarbomol with Aspirin Synalgos  ASA tablets/Enseals Disalcid Micrainin Tagament  Ascriptin Doan's Midol Talwin  Ascriptin A/D Dolene Mobidin Tanderil  Ascriptin Extra Strength Dolobid Moblgesic Ticlid  Ascriptin with Codeine Doloprin or Doloprin with Codeine Momentum Tolectin  Asperbuf Duoprin Mono-gesic Trendar  Aspergum Duradyne Motrin or Motrin IB Triminicin  Aspirin plain, buffered or enteric coated Durasal Myochrisine Trigesic  Aspirin Suppositories Easprin Nalfon Trillsate  Aspirin with Codeine Ecotrin Regular or Extra Strength Naprosyn Uracel  Atromid-S Efficin Naproxen Ursinus  Auranofin Capsules Elmiron Neocylate Vanquish  Axotal Emagrin Norgesic Verin  Azathioprine Empirin or Empirin with Codeine Normiflo Vitamin E  Azolid Emprazil Nuprin Voltaren  Bayer Aspirin plain, buffered or children's or timed BC Tablets or powders Encaprin Orgaran Warfarin Sodium  Buff-a-Comp Enoxaparin Orudis Zorpin  Buff-a-Comp with Codeine Equegesic Os-Cal-Gesic   Buffaprin Excedrin plain, buffered or Extra Strength Oxalid   Bufferin Arthritis Strength Feldene Oxphenbutazone   Bufferin plain or Extra Strength Feldene Capsules Oxycodone with Aspirin   Bufferin with Codeine Fenoprofen Fenoprofen Pabalate or Pabalate-SF    Buffets II Flogesic Panagesic   Buffinol plain or Extra Strength Florinal or Florinal with Codeine Panwarfarin   Buf-Tabs Flurbiprofen Penicillamine   Butalbital Compound Four-way cold tablets Penicillin   Butazolidin Fragmin Pepto-Bismol   Carbenicillin Geminisyn Percodan   Carna Arthritis Reliever Geopen Persantine   Carprofen Gold's salt Persistin   Chloramphenicol Goody's Phenylbutazone   Chloromycetin Haltrain Piroxlcam   Clmetidine heparin Plaquenil   Cllnoril Hyco-pap Ponstel   Clofibrate Hydroxy chloroquine Propoxyphen         Before stopping any of these medications, be sure to consult the physician who ordered them.  Some, such as Coumadin (Warfarin) are ordered to prevent or treat serious conditions such as "deep thrombosis", "pumonary embolisms", and other heart problems.  The amount of time that you may need off of the medication may also vary with the medication and the reason for which you were taking it.  If you are taking any of these medications, please make sure you notify your pain physician before you undergo any procedures.         Selective Nerve Root Block Patient Information  Description: Specific nerve roots exit the spinal canal and these nerves can be compressed and inflamed by a bulging disc and bone spurs.  By injecting steroids on the nerve root, we can potentially decrease the inflammation surrounding these nerves, which often leads to decreased pain.  Also, by injecting local anesthesia on the nerve root, this can provide [place] helpful information to give to your referring doctor if it decreases your pain.  Selective nerve root blocks can be done along the spine from the neck to the low back depending on the location of your pain.   After numbing the skin with local anesthesia, a small needle is passed to the nerve root and the position of the needle is verified using x-ray pictures.  After the needle is in correct position, we then deposit the medication.   You may experience a pressure sensation while this is being done.  The entire block usually lasts less than 15 minutes.  Conditions that may be treated with selective nerve root blocks:  Low back and leg pain  Spinal stenosis  Diagnostic block prior to potential surgery  Neck and arm pain  Post laminectomy syndrome  Preparation for the injection:  1. Do not eat any solid food or dairy products within 8 hours of your appointment. 2. You may drink clear liquids up to 3 hours before an appointment.  Clear liquids include water, black coffee, juice or soda.  No milk or cream please. 3. You may take your regular medications, including pain medications, with a sip of water before your appointment.  Diabetics should hold regular insulin (if taken separately) and take 1/2 normal NPH dose the morning of the procedure.  Carry some sugar containing items with you to your appointment. 4. A driver must accompany you and be prepared to drive you home after your procedure. 5. Bring all your current medications with you. 6. An IV may be inserted and sedation may be given at the discretion of the physician. 7. A blood pressure cuff, EKG, and other monitors will often be applied during the procedure.  Some patients may need to have extra oxygen administered for a short period. 8. You will be asked to provide medical information, including allergies, prior to the procedure.  We must know immediately if you are taking blood  Thinners (like Coumadin) or if you are allergic to IV iodine contrast (dye).  Possible side-effects: All are usually temporary  Bleeding from needle site  Light headedness  Numbness and tingling  Decreased blood pressure  Weakness in arms/legs  Pressure sensation in back/neck  Pain at injection site (several days)  Possible complications: All are extremely rare  Infection  Nerve injury  Spinal headache (a headache wore with upright position)  Call if you  experience:  Fever/chills associated with headache or increased back/neck pain  Headache worsened by an upright position  New onset weakness or numbness of an extremity below the injection site  Hives or difficulty breathing (go to the emergency room)  Inflammation or drainage at the injection site(s)  Severe back/neck pain greater than usual  New symptoms which are concerning to you  Please note:  Although the local anesthetic injected can often make your back or neck feel good for several hours after the injection the pain will  likely return.  It takes 3-5 days for steroids to work on the nerve root. You may not notice any pain relief for at least one week.  If effective, we will often do a series of 3 injections spaced 3-6 weeks apart to maximally decrease your pain.    If you have any questions, please call [phone] [facility]

## 2015-12-26 ENCOUNTER — Telehealth: Payer: Self-pay | Admitting: Pain Medicine

## 2015-12-26 ENCOUNTER — Ambulatory Visit: Payer: Medicare Other | Admitting: Pain Medicine

## 2015-12-26 NOTE — Telephone Encounter (Signed)
Thank you :)

## 2015-12-26 NOTE — Telephone Encounter (Signed)
Called patient to check on procedure appt, left vmail, she returned call stating she was not coming back to clinic.

## 2016-01-01 ENCOUNTER — Ambulatory Visit: Payer: Medicare Other | Admitting: Pain Medicine

## 2016-01-10 ENCOUNTER — Ambulatory Visit: Payer: Medicare Other | Admitting: Pain Medicine

## 2016-03-10 ENCOUNTER — Emergency Department
Admission: EM | Admit: 2016-03-10 | Discharge: 2016-03-10 | Disposition: A | Discharge status: Home / Self Care | Payer: Medicare Other | Attending: Emergency Medicine | Admitting: Emergency Medicine

## 2016-03-10 ENCOUNTER — Encounter: Payer: Self-pay | Admitting: Emergency Medicine

## 2016-03-10 DIAGNOSIS — F1110 Opioid abuse, uncomplicated: Secondary | ICD-10-CM

## 2016-03-10 DIAGNOSIS — F17210 Nicotine dependence, cigarettes, uncomplicated: Secondary | ICD-10-CM | POA: Diagnosis not present

## 2016-03-10 DIAGNOSIS — F1190 Opioid use, unspecified, uncomplicated: Secondary | ICD-10-CM | POA: Insufficient documentation

## 2016-03-10 DIAGNOSIS — Z0289 Encounter for other administrative examinations: Secondary | ICD-10-CM | POA: Diagnosis present

## 2016-03-10 NOTE — ED Provider Notes (Signed)
[facility] Emergency Department Provider Note  Time seen: 1:06 PM  I have reviewed the triage vital signs and the nursing notes.   HISTORY  Chief Complaint Medical Clearance    HPI Lorraine Rose is a 50 y.o. female with a past medical history of anxiety, depression, who presents to the emergency department for opioid use. According to the patient her husband is prescribed Vicodin, she has been using Vicodin recreationally for many years now. Husband has told the patient that he wants her off the medication, patient is seeking detox. She states several months ago she attempted to detox on her own which lasted approximately 2 or 3 weeks before the patient relapsed. States she has never gone somewhere to help her with detox. Denies any other substances besides occasional alcohol use. Denies any medical complaints.  Past Medical History:  Diagnosis Date  . Allergy   . Anxiety    panic attacks  . Depressed affect     Patient Active Problem List   Diagnosis Date Noted  . DDD (degenerative disc disease), lumbar 10/23/2015  . Status post lumbar laminectomy 10/23/2015  . Status post lumbar spinal fusion 10/23/2015  . Facet syndrome, lumbar 10/23/2015  . Lumbar radiculopathy 10/23/2015  . 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 Depressed affect     Past Surgical History:  Procedure Laterality Date  . BACK SURGERY     2004, 2006  . CESAREAN SECTION    . CHOLECYSTECTOMY    . NASAL SINUS SURGERY      Prior to Admission medications   Medication Sig Start Date End Date Taking? Authorizing Provider  acetaminophen (TYLENOL) 500 MG tablet Take 500 mg by mouth every 4 (four) hours as needed.    Historical Provider, MD  ciprofloxacin (CIPRO) 250 MG tablet Take 1 tablet (250 mg total) by mouth 2 (two) times daily. Patient not taking: Reported on 11/20/2015 10/31/15   Mohammed Kindle, MD  cyclobenzaprine (FLEXERIL) 5 MG tablet Take by mouth. Reported on 12/03/2015  12/24/14   Historical Provider, MD  gabapentin (NEURONTIN) 300 MG capsule Reported on 12/03/2015 01/15/15   Historical Provider, MD  gabapentin (NEURONTIN) 300 MG capsule Limit 1 tablet by mouth per day for 3 days then advance to one tablet by mouth twice per day if tolerated and without undesirable side effects 12/11/15   Mohammed Kindle, MD  HYDROcodone-acetaminophen [facility]) 10-325 MG tablet Take 1 tablet by mouth 3 (three) times daily. Reported on 12/03/2015    Historical Provider, MD  HYDROcodone-acetaminophen (NORCO/VICODIN) 5-325 MG tablet Limit one half to one tablet by mouth per day or twice per day if tolerated 12/11/15   Mohammed Kindle, MD  ibuprofen (ADVIL,MOTRIN) 800 MG tablet Take 800 mg by mouth every 8 (eight) hours as needed.    Historical Provider, MD  PARoxetine (PAXIL) 30 MG tablet Take 30 mg by mouth daily.    Historical Provider, MD  traZODone (DESYREL) 50 MG tablet Take by mouth at bedtime.  10/17/15   Historical Provider, MD    Allergies  Allergen Reactions  . Penicillins Hives    Family History  Problem Relation Age of Onset  . Heart disease Mother   . Alzheimer's disease Father     Social History Social History  Substance Use Topics  . Smoking status: Current Some Day Smoker    Packs/day: 0.20    Types: Cigarettes  . Smokeless tobacco: Never Used  . Alcohol use No    Review of Systems Constitutional: Negative for  fever. Cardiovascular: Negative for chest pain. Respiratory: Negative for shortness of breath. Gastrointestinal: Negative for abdominal pain, Musculoskeletal: Negative for back pain Neurological: Negative for headaches, focal weakness or numbness. 10-point ROS otherwise negative.  ____________________________________________   PHYSICAL EXAM:  VITAL SIGNS: ED Triage Vitals [03/10/16 1113]  Enc Vitals Group     BP 120/83     Pulse Rate (!) 102     Resp 18     Temp 98 F (36.7 C)     Temp Source Oral     SpO2 98 %     Weight 140 lb (63.5 kg)      Height 5\' 6"  (1.676 m)     Head Circumference      Peak Flow      Pain Score      Pain Loc      Pain Edu?      Excl. in [place]?     Constitutional: Alert and oriented. Well appearing and in no distress. Eyes: Normal exam ENT   Head: Normocephalic and atraumatic   Mouth/Throat: Mucous membranes are moist. Cardiovascular: Normal rate, regular rhythm. No murmur Respiratory: Normal respiratory effort without tachypnea nor retractions. Breath sounds are clear  Gastrointestinal: Soft and nontender. No distention.  Musculoskeletal: Nontender with normal range of motion in all extremities.  Neurologic:  Normal speech and language. No gross focal neurologic deficits are appreciated. Skin:  Skin is warm, dry and intact.  Psychiatric: Mood and affect are normal. Speech and behavior are normal.   ____________________________________________    INITIAL IMPRESSION / ASSESSMENT AND PLAN / ED COURSE  Pertinent labs & imaging results that were available during my care of the patient were reviewed by me and considered in my medical decision making (see chart for details).  Patient presents for voluntary opioid detox after daily use of Norco/Vicodin for the past multiple years. Patient states her last use was last night, denies any substance use or alcohol use today. States she drinks one or 2 days per week. I offered the patient a medical workup to discuss possible inpatient options such as [facility], patient states she is not interested in going anywhere inpatient, only wishes to go somewhere outpatient. Patient now states she is ready to be discharged home.   ____________________________________________   FINAL CLINICAL IMPRESSION(S) / ED DIAGNOSES  Opioid abuse    Harvest Dark, MD 03/10/16 1313

## 2016-03-10 NOTE — ED Notes (Signed)
Pt a/o, vss. No acute distress at this time.

## 2016-03-10 NOTE — ED Notes (Signed)
Pt request info for rehab so she can leave. States that she has waited long enough. EDP notified.

## 2016-03-10 NOTE — ED Triage Notes (Signed)
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

## 2016-05-03 ENCOUNTER — Encounter: Payer: Self-pay | Admitting: Emergency Medicine

## 2016-05-03 ENCOUNTER — Inpatient Hospital Stay
Admission: EM | Admit: 2016-05-03 | Discharge: 2016-05-05 | DRG: 916 | Disposition: A | Discharge status: Home / Self Care | Payer: Medicare Other | Attending: Family Medicine | Admitting: Family Medicine

## 2016-05-03 DIAGNOSIS — F329 Major depressive disorder, single episode, unspecified: Secondary | ICD-10-CM | POA: Diagnosis present

## 2016-05-03 DIAGNOSIS — Z820 Family history of epilepsy and other diseases of the nervous system: Secondary | ICD-10-CM

## 2016-05-03 DIAGNOSIS — Z880 Allergy status to penicillin: Secondary | ICD-10-CM

## 2016-05-03 DIAGNOSIS — X58XXXA Exposure to other specified factors, initial encounter: Secondary | ICD-10-CM | POA: Diagnosis present

## 2016-05-03 DIAGNOSIS — Z8249 Family history of ischemic heart disease and other diseases of the circulatory system: Secondary | ICD-10-CM

## 2016-05-03 DIAGNOSIS — Y92009 Unspecified place in unspecified non-institutional (private) residence as the place of occurrence of the external cause: Secondary | ICD-10-CM

## 2016-05-03 DIAGNOSIS — F32A Depression, unspecified: Secondary | ICD-10-CM | POA: Diagnosis present

## 2016-05-03 DIAGNOSIS — F17210 Nicotine dependence, cigarettes, uncomplicated: Secondary | ICD-10-CM | POA: Diagnosis present

## 2016-05-03 DIAGNOSIS — G894 Chronic pain syndrome: Secondary | ICD-10-CM | POA: Diagnosis present

## 2016-05-03 DIAGNOSIS — T782XXA Anaphylactic shock, unspecified, initial encounter: Principal | ICD-10-CM | POA: Diagnosis present

## 2016-05-03 DIAGNOSIS — F419 Anxiety disorder, unspecified: Secondary | ICD-10-CM | POA: Diagnosis present

## 2016-05-03 DIAGNOSIS — Z79899 Other long term (current) drug therapy: Secondary | ICD-10-CM

## 2016-05-03 MED ORDER — ONDANSETRON HCL 4 MG/2ML IJ SOLN
4.0000 mg | Freq: Once | INTRAMUSCULAR | Status: AC
  Administered 2016-05-03: 4 mg via INTRAVENOUS

## 2016-05-03 MED ORDER — EPINEPHRINE 0.3 MG/0.3ML IJ SOAJ
0.3000 mg | Freq: Once | INTRAMUSCULAR | Status: AC
  Administered 2016-05-03: 0.3 mg via INTRAMUSCULAR

## 2016-05-03 MED ORDER — DIPHENHYDRAMINE HCL 50 MG/ML IJ SOLN
50.0000 mg | Freq: Once | INTRAMUSCULAR | Status: AC
  Administered 2016-05-03: 50 mg via INTRAVENOUS

## 2016-05-03 MED ORDER — METHYLPREDNISOLONE SODIUM SUCC 125 MG IJ SOLR
125.0000 mg | Freq: Once | INTRAMUSCULAR | Status: AC
  Administered 2016-05-03: 125 mg via INTRAVENOUS

## 2016-05-03 MED ORDER — SODIUM CHLORIDE 0.9 % IV BOLUS (SEPSIS)
1000.0000 mL | Freq: Once | INTRAVENOUS | Status: AC
  Administered 2016-05-03: 1000 mL via INTRAVENOUS

## 2016-05-03 MED ORDER — EPINEPHRINE 0.3 MG/0.3ML IJ SOAJ
INTRAMUSCULAR | Status: AC
  Administered 2016-05-03: 0.3 mg via INTRAMUSCULAR
  Filled 2016-05-03: qty 0.3

## 2016-05-03 MED ORDER — ONDANSETRON HCL 4 MG/2ML IJ SOLN
INTRAMUSCULAR | Status: AC
  Administered 2016-05-03: 4 mg via INTRAVENOUS
  Filled 2016-05-03: qty 2

## 2016-05-03 MED ORDER — FAMOTIDINE IN NACL 20-0.9 MG/50ML-% IV SOLN
20.0000 mg | Freq: Once | INTRAVENOUS | Status: AC
  Administered 2016-05-03: 20 mg via INTRAVENOUS

## 2016-05-03 NOTE — ED Triage Notes (Signed)
Pt to rm 11 from triage for severe allergic reaction, Dr. Owens Shark at bedside, placed on NRB, epi-pen given.

## 2016-05-03 NOTE — ED Notes (Signed)
NRB removed by Dr. Owens Shark

## 2016-05-03 NOTE — ED Provider Notes (Signed)
[facility] Emergency Department Provider Note    First MD Initiated Contact with Patient 05/03/16 2301     (approximate)  I have reviewed the triage vital signs and the nursing notes.   HISTORY  Chief Complaint Allergic Reaction    HPI Lorraine Rose is a 50 y.o. female presents to the emergency department via her husband with suspected allergic reaction. Patient states approximately 15 minutes before presentation she started states in bilateral upper extremity pruritus lightheadedness and sensation as though she was given a pass out. Patient states hives rapidly developed which were generalized. Patient also states admits to dyspnea Patient presents to the emergency department with decreased level of consciousness.   Past Medical History:  Diagnosis Date  . Allergy   . Anxiety    panic attacks  . Depressed affect     Patient Active Problem List   Diagnosis Date Noted  . Anaphylaxis 05/04/2016  . Anxiety 05/04/2016  . Depression 05/04/2016  . DDD (degenerative disc disease), lumbar 10/23/2015  . Status post lumbar laminectomy 10/23/2015  . Status post lumbar spinal fusion 10/23/2015  . Facet syndrome, lumbar 10/23/2015  . Lumbar radiculopathy 10/23/2015  . 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 Depressed affect     Past Surgical History:  Procedure Laterality Date  . BACK SURGERY     2004, 2006  . CESAREAN SECTION    . CHOLECYSTECTOMY    . NASAL SINUS SURGERY      Prior to Admission medications   Medication Sig Start Date End Date Taking? Authorizing Provider  acetaminophen (TYLENOL) 500 MG tablet Take 500 mg by mouth every 4 (four) hours as needed for mild pain, fever or headache.    Yes Historical Provider, MD  gabapentin (NEURONTIN) 300 MG capsule Take 300 mg by mouth 3 (three) times daily. Reported on 12/03/2015 01/15/15  Yes Historical Provider, MD  ibuprofen (ADVIL,MOTRIN) 200 MG tablet Take 800 mg by mouth every 6 (six) hours  as needed for headache or mild pain.   Yes Historical Provider, MD  PARoxetine (PAXIL) 30 MG tablet Take 30 mg by mouth daily.   Yes Historical Provider, MD  traZODone (DESYREL) 100 MG tablet Take 200 mg by mouth at bedtime.  10/17/15  Yes Historical Provider, MD    Allergies Penicillins  Family History  Problem Relation Age of Onset  . Heart disease Mother   . Alzheimer's disease Father     Social History Social History  Substance Use Topics  . Smoking status: Current Some Day Smoker    Packs/day: 0.20    Types: Cigarettes  . Smokeless tobacco: Never Used  . Alcohol use No    Review of Systems Constitutional: No fever/chills Eyes: No visual changes. ENT: No sore throat. Cardiovascular: Denies chest pain. Respiratory: Positive for shortness of breath. Gastrointestinal: No abdominal pain.  No nausea, no vomiting.  No diarrhea.  No constipation. Genitourinary: Negative for dysuria. Musculoskeletal: Negative for back pain. Skin:Positive for generalized rash Neurological: Positive for lightheadedness  10-point ROS otherwise negative.  ____________________________________________   PHYSICAL EXAM:  VITAL SIGNS: ED Triage Vitals  Enc Vitals Group     BP 05/03/16 2309 92/79     Pulse Rate 05/03/16 2309 (!) 59     Resp 05/03/16 2309 17     Temp --      Temp src --      SpO2 05/03/16 2309 100 %     Weight 05/03/16 2320 153 lb 9.6 oz (69.7 kg)  Height 05/03/16 2320 5\' 6"  (1.676 m)     Head Circumference --      Peak Flow --      Pain Score 05/03/16 2311 0     Pain Loc --      Pain Edu? --      Excl. in [place]? --     Constitutional: Decreased level of consciousness Eyes: Conjunctivae are normal. PERRL. EOMI. Head: Atraumatic. Ears:  Healthy appearing ear canals and TMs bilaterally Nose: No congestion/rhinnorhea. Mouth/Throat: Mucous membranes are moist.  Oropharynx non-erythematous. Neck: No stridor.  No meningeal signs.  Cardiovascular: Normal rate, regular  rhythm. Good peripheral circulation. Grossly normal heart sounds. Respiratory: Normal respiratory effort.  No retractions. Lungs CTAB. Gastrointestinal: Soft and nontender. No distention.  Musculoskeletal: No lower extremity tenderness nor edema. No gross deformities of extremities. Neurologic:  Normal speech and language. No gross focal neurologic deficits are appreciated.  Skin:  Generalized urticarial rash most pronounced on the lower extremities Psychiatric: Mood and affect are normal. Speech and behavior are normal.  _  _   PROCEDURES  Procedure(s) performed:   Procedures   Critical Care performed:CRITICAL CARE Performed by: Gregor Hams   Total critical care time: 45  minutes  Critical care time was exclusive of separately billable procedures and treating other patients.  Critical care was necessary to treat or prevent imminent or life-threatening deterioration.  Critical care was time spent personally by me on the following activities: development of treatment plan with patient and/or surrogate as well as nursing, discussions with consultants, evaluation of patient's response to treatment, examination of patient, obtaining history from patient or surrogate, ordering and performing treatments and interventions, ordering and review of laboratory studies, ordering and review of radiographic studies, pulse oximetry and re-evaluation of patient's condition.  ____________________________________________   INITIAL IMPRESSION / ASSESSMENT AND PLAN / ED COURSE  Pertinent labs & imaging results that were available during my care of the patient were reviewed by me and considered in my medical decision making (see chart for details).  Given presenting symptoms patient received IM epinephrine 0.3 mg, cimetidine 125 mg Pepcid 20 mg and Benadryl 50 mg on presentation to emergency part. Following the aforementioned intervention patient's hives improved considerably and she stated  improvement in dyspnea.  Notified by the nursing staff that the patient had a return of worsening dyspnea and facial swelling as well as recurrence of the urticarial rash. On my arrival to the room the patient's face was indeed swollen in comparison to previous evaluation. Urticarial rash reoccurrence bilateral upper stomach trunk and lower extremity. As such epinephrine readministered 0.3 mg. Given ongoing symptoms epinephrine drip started as well patient maintaining airway well. Patient discussed with intensivist and Dr. Marcille Blanco for hospital admission for further evaluation and management   Clinical Course    ____________________________________________  FINAL CLINICAL IMPRESSION(S) / ED DIAGNOSES  Final diagnoses:  Anaphylaxis, initial encounter     MEDICATIONS GIVEN DURING THIS VISIT:  Medications  EPINEPHrine (ADRENALIN) 4 mg in dextrose 5 % 250 mL (0.016 mg/mL) infusion (1.5 mcg/min Intravenous Transfusing/Transfer 05/04/16 0309)  methylPREDNISolone sodium succinate (SOLU-MEDROL) 125 mg/2 mL injection 60 mg (not administered)  diphenhydrAMINE (BENADRYL) injection 25 mg (25 mg Intravenous Given 05/04/16 0551)  PARoxetine (PAXIL) tablet 30 mg (not administered)  enoxaparin (LOVENOX) injection 40 mg (not administered)  sodium chloride flush (NS) 0.9 % injection 3 mL (not administered)  0.9 %  sodium chloride infusion ( Intravenous New Bag/Given 05/04/16 0417)  acetaminophen (TYLENOL) tablet 650 mg (  not administered)    Or  acetaminophen (TYLENOL) suppository 650 mg (not administered)  ondansetron (ZOFRAN) tablet 4 mg ( Oral See Alternative 05/04/16 0552)    Or  ondansetron (ZOFRAN) injection 4 mg (4 mg Intravenous Given 05/04/16 0552)  ibuprofen (ADVIL,MOTRIN) tablet 800 mg (800 mg Oral Given 05/04/16 0431)  Influenza vac split quadrivalent PF (FLUARIX) injection 0.5 mL (not administered)  magnesium sulfate IVPB 2 g 50 mL (not administered)  potassium phosphate (monobasic) (K-PHOS  ORIGINAL) tablet 1,000 mg (not administered)  nicotine (NICODERM CQ - dosed in mg/24 hr) patch 7 mg (not administered)  EPINEPHrine (EPI-PEN) injection 0.3 mg (0.3 mg Intramuscular Given 05/03/16 2308)  methylPREDNISolone sodium succinate (SOLU-MEDROL) 125 mg/2 mL injection 125 mg (125 mg Intravenous Given 05/03/16 2310)  diphenhydrAMINE (BENADRYL) injection 50 mg (50 mg Intravenous Given 05/03/16 2309)  ondansetron (ZOFRAN) injection 4 mg (4 mg Intravenous Given 05/03/16 2310)  famotidine (PEPCID) IVPB 20 mg premix (0 mg Intravenous Stopped 05/03/16 2322)  sodium chloride 0.9 % bolus 1,000 mL (0 mLs Intravenous Stopped 05/04/16 0005)  sodium chloride 0.9 % bolus 1,000 mL (0 mLs Intravenous Stopped 05/04/16 0005)  ondansetron (ZOFRAN) injection 4 mg (4 mg Intravenous Given 05/04/16 0007)  sodium chloride 0.9 % bolus 1,000 mL (0 mLs Intravenous Stopped 05/04/16 0131)  EPINEPHrine (EPI-PEN) injection 0.3 mg (0.3 mg Intramuscular Given 05/04/16 0020)  metoCLOPramide (REGLAN) injection 10 mg (10 mg Intravenous Given 05/04/16 0026)     NEW OUTPATIENT MEDICATIONS STARTED DURING THIS VISIT:  Current Discharge Medication List      Current Discharge Medication List      Current Discharge Medication List       Note:  This document was prepared using Dragon voice recognition software and may include unintentional dictation errors.    Gregor Hams, MD 05/04/16 [phone]

## 2016-05-04 ENCOUNTER — Encounter: Payer: Self-pay | Admitting: Adult Health

## 2016-05-04 DIAGNOSIS — Y92009 Unspecified place in unspecified non-institutional (private) residence as the place of occurrence of the external cause: Secondary | ICD-10-CM | POA: Diagnosis not present

## 2016-05-04 DIAGNOSIS — G894 Chronic pain syndrome: Secondary | ICD-10-CM | POA: Diagnosis present

## 2016-05-04 DIAGNOSIS — F419 Anxiety disorder, unspecified: Secondary | ICD-10-CM | POA: Diagnosis present

## 2016-05-04 DIAGNOSIS — Z8249 Family history of ischemic heart disease and other diseases of the circulatory system: Secondary | ICD-10-CM | POA: Diagnosis not present

## 2016-05-04 DIAGNOSIS — F329 Major depressive disorder, single episode, unspecified: Secondary | ICD-10-CM

## 2016-05-04 DIAGNOSIS — F17210 Nicotine dependence, cigarettes, uncomplicated: Secondary | ICD-10-CM | POA: Diagnosis present

## 2016-05-04 DIAGNOSIS — Z820 Family history of epilepsy and other diseases of the nervous system: Secondary | ICD-10-CM | POA: Diagnosis not present

## 2016-05-04 DIAGNOSIS — T782XXA Anaphylactic shock, unspecified, initial encounter: Secondary | ICD-10-CM | POA: Diagnosis not present

## 2016-05-04 DIAGNOSIS — Z79899 Other long term (current) drug therapy: Secondary | ICD-10-CM | POA: Diagnosis not present

## 2016-05-04 DIAGNOSIS — X58XXXA Exposure to other specified factors, initial encounter: Secondary | ICD-10-CM | POA: Diagnosis present

## 2016-05-04 DIAGNOSIS — F32A Depression, unspecified: Secondary | ICD-10-CM | POA: Diagnosis present

## 2016-05-04 DIAGNOSIS — Z880 Allergy status to penicillin: Secondary | ICD-10-CM | POA: Diagnosis not present

## 2016-05-04 LAB — COMPREHENSIVE METABOLIC PANEL
ALT: 174 U/L — ABNORMAL HIGH (ref 14–54)
AST: 432 U/L — ABNORMAL HIGH (ref 15–41)
Albumin: 3.9 g/dL (ref 3.5–5.0)
Alkaline Phosphatase: 62 U/L (ref 38–126)
Anion gap: 8 (ref 5–15)
BUN: 15 mg/dL (ref 6–20)
CO2: 28 mmol/L (ref 22–32)
Calcium: 8.6 mg/dL — ABNORMAL LOW (ref 8.9–10.3)
Chloride: 102 mmol/L (ref 101–111)
Creatinine, Ser: 0.69 mg/dL (ref 0.44–1.00)
GFR calc Af Amer: >60 mL/min (ref >60)
GFR calc non Af Amer: >60 mL/min (ref >60)
Glucose, Bld: 148 mg/dL — ABNORMAL HIGH (ref 65–99)
Potassium: 3.3 mmol/L — ABNORMAL LOW (ref 3.5–5.1)
Sodium: 138 mmol/L (ref 135–145)
Total Bilirubin: 0.6 mg/dL (ref 0.3–1.2)
Total Protein: 6.7 g/dL (ref 6.5–8.1)

## 2016-05-04 LAB — CBC
HCT: 43 % (ref 35.0–47.0)
Hemoglobin: 14.9 g/dL (ref 12.0–16.0)
MCH: 33.9 pg (ref 26.0–34.0)
MCHC: 34.6 g/dL (ref 32.0–36.0)
MCV: 97.7 fL (ref 80.0–100.0)
Platelets: 343 10*3/uL (ref 150–440)
RBC: 4.4 MIL/uL (ref 3.80–5.20)
RDW: 15.6 % — ABNORMAL HIGH (ref 11.5–14.5)
WBC: 12.7 10*3/uL — ABNORMAL HIGH (ref 3.6–11.0)

## 2016-05-04 LAB — URINE DRUG SCREEN, QUALITATIVE (ARMC ONLY)
Amphetamines, Ur Screen: NOT DETECTED
Barbiturates, Ur Screen: NOT DETECTED
Benzodiazepine, Ur Scrn: NOT DETECTED
Cannabinoid 50 Ng, Ur ~~LOC~~: NOT DETECTED
Cocaine Metabolite,Ur ~~LOC~~: NOT DETECTED
MDMA (Ecstasy)Ur Screen: NOT DETECTED
Methadone Scn, Ur: NOT DETECTED
Opiate, Ur Screen: NOT DETECTED
Phencyclidine (PCP) Ur S: NOT DETECTED
Tricyclic, Ur Screen: NOT DETECTED

## 2016-05-04 LAB — GLUCOSE, CAPILLARY: Glucose-Capillary: 160 mg/dL — ABNORMAL HIGH (ref 65–99)

## 2016-05-04 LAB — MAGNESIUM: Magnesium: 1.4 mg/dL — ABNORMAL LOW (ref 1.7–2.4)

## 2016-05-04 LAB — PHOSPHORUS: Phosphorus: 2 mg/dL — ABNORMAL LOW (ref 2.5–4.6)

## 2016-05-04 LAB — MRSA PCR SCREENING: MRSA by PCR: NEGATIVE

## 2016-05-04 LAB — LACTIC ACID, PLASMA
Lactic Acid, Venous: 2.2 mmol/L (ref 0.5–1.9)
Lactic Acid, Venous: 2.9 mmol/L (ref 0.5–1.9)

## 2016-05-04 MED ORDER — METOCLOPRAMIDE HCL 5 MG/ML IJ SOLN
INTRAMUSCULAR | Status: AC
  Administered 2016-05-04: 10 mg via INTRAVENOUS
  Filled 2016-05-04: qty 2

## 2016-05-04 MED ORDER — SODIUM CHLORIDE 0.9% FLUSH
3.0000 mL | Freq: Two times a day (BID) | INTRAVENOUS | Status: DC
  Administered 2016-05-04: 3 mL via INTRAVENOUS

## 2016-05-04 MED ORDER — METOCLOPRAMIDE HCL 5 MG/ML IJ SOLN
10.0000 mg | Freq: Once | INTRAMUSCULAR | Status: AC
  Administered 2016-05-04: 10 mg via INTRAVENOUS

## 2016-05-04 MED ORDER — ACETAMINOPHEN 650 MG RE SUPP: 650.0000 mg | Freq: Four times a day (QID) | RECTAL | Status: DC | PRN

## 2016-05-04 MED ORDER — POTASSIUM PHOSPHATE MONOBASIC 500 MG PO TABS
1000.0000 mg | ORAL_TABLET | Freq: Three times a day (TID) | ORAL | Status: DC
  Administered 2016-05-04 – 2016-05-05 (×4): 1000 mg via ORAL
  Filled 2016-05-04 (×5): qty 2

## 2016-05-04 MED ORDER — EPINEPHRINE 0.3 MG/0.3ML IJ SOAJ
0.3000 mg | Freq: Once | INTRAMUSCULAR | Status: AC
  Administered 2016-05-04: 0.3 mg via INTRAMUSCULAR

## 2016-05-04 MED ORDER — ONDANSETRON HCL 4 MG PO TABS: 4.0000 mg | ORAL_TABLET | Freq: Four times a day (QID) | ORAL | Status: DC | PRN

## 2016-05-04 MED ORDER — INFLUENZA VAC SPLIT QUAD 0.5 ML IM SUSY: 0.5000 mL | PREFILLED_SYRINGE | INTRAMUSCULAR | Status: DC

## 2016-05-04 MED ORDER — NICOTINE 7 MG/24HR TD PT24
7.0000 mg | MEDICATED_PATCH | Freq: Every day | TRANSDERMAL | Status: DC
  Administered 2016-05-04 – 2016-05-05 (×2): 7 mg via TRANSDERMAL
  Filled 2016-05-04 (×2): qty 1

## 2016-05-04 MED ORDER — SODIUM CHLORIDE 0.9% FLUSH: 3.0000 mL | INTRAVENOUS | Status: DC | PRN

## 2016-05-04 MED ORDER — DIPHENHYDRAMINE HCL 50 MG/ML IJ SOLN
25.0000 mg | Freq: Three times a day (TID) | INTRAMUSCULAR | Status: DC
  Administered 2016-05-04: 25 mg via INTRAVENOUS
  Filled 2016-05-04: qty 1

## 2016-05-04 MED ORDER — SODIUM CHLORIDE 0.9% FLUSH
3.0000 mL | Freq: Two times a day (BID) | INTRAVENOUS | Status: DC
  Administered 2016-05-04: 11:00:00 3 mL via INTRAVENOUS

## 2016-05-04 MED ORDER — IBUPROFEN 400 MG PO TABS
800.0000 mg | ORAL_TABLET | Freq: Three times a day (TID) | ORAL | Status: DC | PRN
  Administered 2016-05-04 – 2016-05-05 (×3): 800 mg via ORAL
  Filled 2016-05-04 (×3): qty 2

## 2016-05-04 MED ORDER — ENOXAPARIN SODIUM 40 MG/0.4ML ~~LOC~~ SOLN: 40.0000 mg | SUBCUTANEOUS | Status: DC

## 2016-05-04 MED ORDER — SODIUM CHLORIDE 0.9 % IV BOLUS (SEPSIS)
1000.0000 mL | Freq: Once | INTRAVENOUS | Status: AC
  Administered 2016-05-04: 1000 mL via INTRAVENOUS

## 2016-05-04 MED ORDER — EPINEPHRINE HCL 1 MG/ML IJ SOLN
0.5000 ug/min | INTRAVENOUS | Status: DC
  Administered 2016-05-04: 0.5 ug/min via INTRAVENOUS
  Filled 2016-05-04: qty 4

## 2016-05-04 MED ORDER — DIPHENHYDRAMINE HCL 25 MG PO CAPS
25.0000 mg | ORAL_CAPSULE | Freq: Four times a day (QID) | ORAL | Status: DC
  Administered 2016-05-04 – 2016-05-05 (×5): 25 mg via ORAL
  Filled 2016-05-04 (×5): qty 1

## 2016-05-04 MED ORDER — METHYLPREDNISOLONE SODIUM SUCC 125 MG IJ SOLR
60.0000 mg | Freq: Two times a day (BID) | INTRAMUSCULAR | Status: DC
  Administered 2016-05-04 – 2016-05-05 (×2): 60 mg via INTRAVENOUS
  Filled 2016-05-04 (×3): qty 2

## 2016-05-04 MED ORDER — ACETAMINOPHEN 325 MG PO TABS
650.0000 mg | ORAL_TABLET | Freq: Four times a day (QID) | ORAL | Status: DC | PRN
  Administered 2016-05-04 – 2016-05-05 (×2): 650 mg via ORAL
  Filled 2016-05-04 (×2): qty 2

## 2016-05-04 MED ORDER — ONDANSETRON HCL 4 MG/2ML IJ SOLN
4.0000 mg | Freq: Once | INTRAMUSCULAR | Status: AC
  Administered 2016-05-04: 4 mg via INTRAVENOUS

## 2016-05-04 MED ORDER — MAGNESIUM SULFATE 2 GM/50ML IV SOLN
2.0000 g | Freq: Once | INTRAVENOUS | Status: AC
  Administered 2016-05-04: 2 g via INTRAVENOUS
  Filled 2016-05-04: qty 50

## 2016-05-04 MED ORDER — PAROXETINE HCL 20 MG PO TABS
30.0000 mg | ORAL_TABLET | Freq: Every day | ORAL | Status: DC
  Administered 2016-05-04 – 2016-05-05 (×2): 30 mg via ORAL
  Filled 2016-05-04 (×2): qty 2

## 2016-05-04 MED ORDER — SODIUM CHLORIDE 0.9 % IV SOLN
INTRAVENOUS | Status: DC
  Administered 2016-05-04: 04:00:00 via INTRAVENOUS

## 2016-05-04 MED ORDER — SODIUM CHLORIDE 0.9 % IV SOLN: 250.0000 mL | INTRAVENOUS | Status: DC | PRN

## 2016-05-04 MED ORDER — ONDANSETRON HCL 4 MG/2ML IJ SOLN
4.0000 mg | Freq: Four times a day (QID) | INTRAMUSCULAR | Status: DC | PRN
  Administered 2016-05-04: 4 mg via INTRAVENOUS
  Filled 2016-05-04: qty 2

## 2016-05-04 NOTE — H&P (Signed)
[facility] [place] NAME: Lorraine Rose    MR#:  [id number]  DATE OF BIRTH:  [birth year]  DATE OF ADMISSION:  05/03/2016  PRIMARY CARE PHYSICIAN: Dion Body, MD   REQUESTING/REFERRING PHYSICIAN: Owens Shark, MD  CHIEF COMPLAINT:   Chief Complaint  Patient presents with  . Allergic Reaction    HISTORY OF PRESENT ILLNESS:  Lorraine Rose  is a 50 y.o. female who presents with Anaphylaxis. Patient states that she does not have any known allergies other than hives with penicillin use. She has never had prior episodes of anaphylaxis. Tonight she developed acute onset of hives, and then began to have diffuse swelling as she describes it. Shortly after that she became dyspneic and came to the ED for evaluation. Here she was given Solu-Medrol and Benadryl, but had progression of her symptoms. She was started on IV drip and responded very well to it. Intensivists were initially consulted for admission given the fact the patient was on an epi drip, and stated that she was hemodynamically stable and protecting her airway with the epidurogram place, that she should be admitted to stepdown on the hospitalist service.  PAST MEDICAL HISTORY:   Past Medical History:  Diagnosis Date  . Allergy   . Anxiety    panic attacks  . Depressed affect     PAST SURGICAL HISTORY:   Past Surgical History:  Procedure Laterality Date  . BACK SURGERY     2004, 2006  . CESAREAN SECTION    . CHOLECYSTECTOMY    . NASAL SINUS SURGERY      SOCIAL HISTORY:   Social History  Substance Use Topics  . Smoking status: Current Some Day Smoker    Packs/day: 0.20    Types: Cigarettes  . Smokeless tobacco: Never Used  . Alcohol use No    FAMILY HISTORY:   Family History  Problem Relation Age of Onset  . Heart disease Mother   . Alzheimer's disease Father     DRUG ALLERGIES:   Allergies  Allergen Reactions  . Penicillins Hives, Swelling and Other  (See Comments)    Has patient had a PCN reaction causing immediate rash, facial/tongue/throat swelling, SOB or lightheadedness with hypotension: No Has patient had a PCN reaction causing severe rash involving mucus membranes or skin necrosis: No Has patient had a PCN reaction that required hospitalization No Has patient had a PCN reaction occurring within the last 10 years: No If all of the above answers are "NO", then may proceed with Cephalosporin use.     MEDICATIONS AT HOME:   Prior to Admission medications   Medication Sig Start Date End Date Taking? Authorizing Provider  acetaminophen (TYLENOL) 500 MG tablet Take 500 mg by mouth every 4 (four) hours as needed for mild pain, fever or headache.    Yes Historical Provider, MD  gabapentin (NEURONTIN) 300 MG capsule Take 300 mg by mouth 3 (three) times daily. Reported on 12/03/2015 01/15/15  Yes Historical Provider, MD  ibuprofen (ADVIL,MOTRIN) 200 MG tablet Take 800 mg by mouth every 6 (six) hours as needed for headache or mild pain.   Yes Historical Provider, MD  PARoxetine (PAXIL) 30 MG tablet Take 30 mg by mouth daily.   Yes Historical Provider, MD  traZODone (DESYREL) 100 MG tablet Take 200 mg by mouth at bedtime.  10/17/15  Yes Historical Provider, MD    REVIEW OF SYSTEMS:  Review of Systems  Constitutional: Negative for chills, fever, malaise/fatigue and  weight loss.  HENT: Negative for ear pain, hearing loss and tinnitus.   Eyes: Negative for blurred vision, double vision, pain and redness.  Respiratory: Positive for shortness of breath. Negative for cough and hemoptysis.   Cardiovascular: Negative for chest pain, palpitations, orthopnea and leg swelling.  Gastrointestinal: Negative for abdominal pain, constipation, diarrhea, nausea and vomiting.  Genitourinary: Negative for dysuria, frequency and hematuria.  Musculoskeletal: Negative for back pain, joint pain and neck pain.  Skin:       No acne, rash, or lesions  Neurological:  Negative for dizziness, tremors, focal weakness and weakness.  Endo/Heme/Allergies: Negative for polydipsia. Does not bruise/bleed easily.  Psychiatric/Behavioral: Negative for depression. The patient is not nervous/anxious and does not have insomnia.      VITAL SIGNS:   Vitals:   05/04/16 0050 05/04/16 0100 05/04/16 0115 05/04/16 0130  BP: 110/87 112/88 113/85 100/74  Pulse: 81 85 84 85  Resp: (!) 22 18 (!) 28 19  SpO2: 98% 97% 98% 94%  Weight:      Height:       Wt Readings from Last 3 Encounters:  05/03/16 69.7 kg (153 lb 9.6 oz)  03/10/16 63.5 kg (140 lb)  12/11/15 65.8 kg (145 lb)    PHYSICAL EXAMINATION:  Physical Exam  Vitals reviewed. Constitutional: She is oriented to person, place, and time. She appears well-developed and well-nourished. No distress.  HENT:  Head: Normocephalic and atraumatic.  Mouth/Throat: Oropharynx is clear and moist.  Eyes: Conjunctivae and EOM are normal. Pupils are equal, round, and reactive to light. No scleral icterus.  Neck: Normal range of motion. Neck supple. No JVD present. No thyromegaly present.  Cardiovascular: Normal rate, regular rhythm and intact distal pulses.  Exam reveals no gallop and no friction rub.   No murmur heard. Respiratory: Effort normal and breath sounds normal. No respiratory distress. She has no wheezes. She has no rales.  GI: Soft. Bowel sounds are normal. She exhibits no distension. There is no tenderness.  Musculoskeletal: Normal range of motion. She exhibits no edema.  No arthritis, no gout  Lymphadenopathy:    She has no cervical adenopathy.  Neurological: She is alert and oriented to person, place, and time. No cranial nerve deficit.  No dysarthria, no aphasia  Skin: Skin is warm and dry. No rash noted. No erythema.  Psychiatric: She has a normal mood and affect. Her behavior is normal. Judgment and thought content normal.    LABORATORY PANEL:   CBC  Recent Labs Lab 05/03/16 2323  WBC 12.7*  HGB  14.9  HCT 43.0  PLT 343   ------------------------------------------------------------------------------------------------------------------  Chemistries   Recent Labs Lab 05/03/16 2323  NA 138  K 3.3*  CL 102  CO2 28  GLUCOSE 148*  BUN 15  CREATININE 0.69  CALCIUM 8.6*  AST 432*  ALT 174*  ALKPHOS 62  BILITOT 0.6   ------------------------------------------------------------------------------------------------------------------  Cardiac Enzymes No results for input(s): TROPONINI in the last 168 hours. ------------------------------------------------------------------------------------------------------------------  RADIOLOGY:  No results found.  EKG:   Orders placed or performed during the hospital encounter of 05/03/16  . EKG 12-Lead  . EKG 12-Lead    IMPRESSION AND PLAN:  Principal Problem:   Anaphylaxis - she initially had rapidly progressing symptoms which improved greatly with treatment here in the ED. She did require continuous epi drip. She'll be admitted to stepdown unit for monitoring tonight until epi drip can be weaned. We'll continue IV steroids and scheduled Benadryl for now. Active Problems:  Anxiety - continue home meds   Depression - continue home meds  All the records are reviewed and case discussed with ED provider. Management plans discussed with the patient and/or family.  DVT PROPHYLAXIS: SubQ lovenox  GI PROPHYLAXIS: None  ADMISSION STATUS: Inpatient  CODE STATUS: Full Code Status History    This patient does not have a recorded code status. Please follow your organizational policy for patients in this situation.      TOTAL TIME TAKING CARE OF THIS PATIENT: 45 minutes.    Oskar Cretella [place] 05/04/2016, 1:52 AM  [facility]  Office  [phone]  CC: Primary care physician; Dion Body, MD

## 2016-05-04 NOTE — Progress Notes (Signed)
Dr. Marcille Blanco notified of pt's increase in lactic acid. Will continue to monitor, no new orders at this time.

## 2016-05-04 NOTE — Consult Note (Signed)
PULMONARY / CRITICAL CARE MEDICINE   Name: Lorraine Rose MRN: [id number] DOB: [birth year]    ADMISSION DATE:  05/03/2016   CONSULTATION DATE:  05/04/2016  REFERRING MD:  Dr Owens Shark  CHIEF COMPLAINT:  Rash, itching and facial swelling  HISTORY OF PRESENT ILLNESS:   This is a 50 y/o Caucasian female with a PMH of depression, anxiety and opioid abuse who presents with sudden onset erythematous generalized pruritic rash, shortness of breath and facial swelling.  She states that symptoms started around 9 PM. Describes rash as generalized, red, itchy and associated with chest tightness and facial swelling. She reports taking a tablet of trazodone to go to sleep when symptoms started. She had taken Alka-Seltzer and at a fish sandwich for breakfast. She is allergic to penicillins and denies taking any penicillin consent obtaining substances today. She had a sensation history of opiate abuse and reports taking no other substances today besides the medications that she took.  At the ED, patient was given Benadryl, Solu-Medrol, EpiPen, and Pepcid. She felt much better and her symptoms improved but then she developed a delayed reaction with a severe recurrence of the rash, chest tightness and facial swelling. She was given another dose of EpiPen and started on an epinephrine infusion. She is currently sleeping, her blood pressure was normal, and she is on room air. She denies any difficulty breathing, chest tightness, feeling of tongue swelling, feeling of throat swelling, itching and the mouth and throat, and changes in her voice. She also reports significant improvement in rash and itching.  PAST MEDICAL HISTORY :  She  has a past medical history of Allergy; Anxiety; and Depressed affect.  PAST SURGICAL HISTORY: She  has a past surgical history that includes Cholecystectomy; Nasal sinus surgery; Back surgery; and Cesarean section.  Allergies  Allergen Reactions  . Penicillins Hives, Swelling and Other  (See Comments)    Has patient had a PCN reaction causing immediate rash, facial/tongue/throat swelling, SOB or lightheadedness with hypotension: No Has patient had a PCN reaction causing severe rash involving mucus membranes or skin necrosis: No Has patient had a PCN reaction that required hospitalization No Has patient had a PCN reaction occurring within the last 10 years: No If all of the above answers are "NO", then may proceed with Cephalosporin use.     Current Facility-Administered Medications on File Prior to Encounter  Medication  . bupivacaine (PF) (MARCAINE) 0.25 % injection 30 mL  . bupivacaine (PF) (MARCAINE) 0.25 % injection 30 mL  . ciprofloxacin (CIPRO) IVPB 400 mg  . ciprofloxacin (CIPRO) IVPB 400 mg  . ciprofloxacin (CIPRO) tablet 250 mg  . fentaNYL (SUBLIMAZE) injection 100 mcg  . fentaNYL (SUBLIMAZE) injection 100 mcg  . lactated ringers infusion 1,000 mL  . lactated ringers infusion 1,000 mL  . midazolam (VERSED) 5 MG/5ML injection 5 mg  . midazolam (VERSED) 5 MG/5ML injection 5 mg  . orphenadrine (NORFLEX) injection 60 mg  . orphenadrine (NORFLEX) injection 60 mg  . triamcinolone acetonide (KENALOG-40) injection 40 mg  . triamcinolone acetonide (KENALOG-40) injection 40 mg   Current Outpatient Prescriptions on File Prior to Encounter  Medication Sig  . acetaminophen (TYLENOL) 500 MG tablet Take 500 mg by mouth every 4 (four) hours as needed for mild pain, fever or headache.   . gabapentin (NEURONTIN) 300 MG capsule Take 300 mg by mouth 3 (three) times daily. Reported on 12/03/2015  . PARoxetine (PAXIL) 30 MG tablet Take 30 mg by mouth daily.  . traZODone (DESYREL)  100 MG tablet Take 200 mg by mouth at bedtime.     FAMILY HISTORY:  Her indicated that her mother is deceased. She indicated that her father is alive.    SOCIAL HISTORY: She  reports that she has been smoking Cigarettes.  She has been smoking about 0.20 packs per day. She has never used smokeless  tobacco. She reports that she does not drink alcohol or use drugs.  REVIEW OF SYSTEMS:   Constitutional: Negative for fever and chills.  HENT: Negative for congestion and rhinorrhea.  Eyes: Negative for redness and visual disturbance.  Respiratory: Positive for shortness of breath but negative for cough and wheezing.  Cardiovascular: Negative for chest pain and palpitations.  Gastrointestinal: Negative  for nausea , vomiting and abdominal pain and loose stools Genitourinary: Negative for dysuria and urgency.  Endocrine: Denies polyuria, polyphagia and heat intolerance Musculoskeletal: Negative for myalgias and arthralgias.  Skin: Negative for pallor and wound but positive for rash and pruritis.  Neurological: Negative for dizziness and headaches   SUBJECTIVE:   VITAL SIGNS: BP 109/81   Pulse 83   Resp 16   Ht 5\' 6"  (1.676 m)   Wt 153 lb 9.6 oz (69.7 kg)   LMP 05/02/2016   SpO2 95%   BMI 24.79 kg/m   HEMODYNAMICS:    VENTILATOR SETTINGS:    INTAKE / OUTPUT: No intake/output data recorded.  PHYSICAL EXAMINATION: General: Sleeping, no acute distress Neuro: Alert to person, place, time and situation, moves all extremities, no focal deficits HEENT: Is normocephalic and atraumatic, and nasal passages patent, PERRLA, neck is supple with good range of motion, no JVD, oral mucosa moist and pink, tongue midline, and  normal shape without swelling, trachea midline Cardiovascular: Rate and rhythm regular, S1, S2 audible, no murmur, regurg or gallop Lungs:  Normal work of breathing, lungs are clear to auscultation bilaterally without any rhonchi or wheezing Abdomen:  Abdomen is nondistended, normal bowel sounds and no palpable organomegaly Musculoskeletal:  Mild range of motion, no visible deformities Extremities: +2 pulses bilaterally, no edema Skin: Warm and dry, mildly erythematous rash in the lower extremities and upper extremities  LABS:  BMET  Recent Labs Lab  05/03/16 2323  NA 138  K 3.3*  CL 102  CO2 28  BUN 15  CREATININE 0.69  GLUCOSE 148*    Electrolytes  Recent Labs Lab 05/03/16 2323  CALCIUM 8.6*    CBC  Recent Labs Lab 05/03/16 2323  WBC 12.7*  HGB 14.9  HCT 43.0  PLT 343    Coag's No results for input(s): APTT, INR in the last 168 hours.  Sepsis Markers  Recent Labs Lab 05/04/16 0032  LATICACIDVEN 2.2*    ABG No results for input(s): PHART, PCO2ART, PO2ART in the last 168 hours.  Liver Enzymes  Recent Labs Lab 05/03/16 2323  AST 432*  ALT 174*  ALKPHOS 62  BILITOT 0.6  ALBUMIN 3.9    Cardiac Enzymes No results for input(s): TROPONINI, PROBNP in the last 168 hours.  Glucose No results for input(s): GLUCAP in the last 168 hours.  Imaging No results found.   STUDIES:  None  CULTURES: None  ANTIBIOTICS: None  SIGNIFICANT EVENTS: 05/03/2016: ED with allergic reaction to unknown substance, admitted  LINES/TUBES: Peripheral IVs  DISCUSSION: 50 year old Caucasian female presenting with an acute allergic reaction to an unknown substance. She is currently hemodynamically stable and maintaining her  airway  ASSESSMENT / PLAN:  PULMONARY A: High risk for intubation due to allergic  reaction/airway swelling-currently stable P:   Monitor respiratory status closely  CARDIOVASCULAR A:  High risk for cardiovascular compromise secondary to anaphylaxis-her blood pressure is currently stable; she is on 1.5 g of epinephrine P:  Hemodynamics monitoring per ICU protocol. Continue epinephrine infusion. Continue when necessary patent EpiPen IV fluids  RENAL A:   No acute issues P:   Monitor and replace electrolytes  GASTROINTESTINAL A:   No acute issues P:   Oral intake as tolerated  HEMATOLOGIC A:   Anaphylaxis/allergic reaction to unknown substance-it is unclear if this is a delayed reaction to trazodone or related Alka-Seltzer cold to anything that she ate earlier P:   Monitor for worsening reaction. Continue epinephrine infusion, EpiPen, Pepcid, IV steroids and IV fluids Monitor and identify triggers and delayed reaction   ENDOCRINE A:   No acute issues P:   .Blood sugars with BMP  NEUROLOGIC A:   History of anxiety and depression, currently on Paxil. History of substance abuse-urine toxicology negative Chronic pain syndrome-she takes gabapentin, ibuprofen and Tylenol at home as needed P:   Continue Paxil, and trazodoneat bedtime   Disposition and family update: No family at bedside. Patient updated on current treatment plan.  Total CCM time 40 minutes  [facility] ANP-BC Pulmonary and Critical Care Medicine [facility] [place] Pager [phone] or [phone]  05/04/2016, 2:32 AM   STAFF NOTE: I, Dr. Vilinda Rose have personally reviewed patient's available data, including medical history, events of note, physical examination and test results as part of my evaluation. I have discussed with resident/NP NP Patria Mane and other care providers such as pharmacist, RN and RRT.  In addition,  I personally evaluated patient and elicited key findings of   HPI:  50 yo F past medical history of depression, anxiety, opiate abuse presents with sudden onset of generalized. A crash, shortness of breath and facial swelling including the lips and eyes. Patient stated symptoms started around 9 PM yesterday, described rash is generalized, red, otic, associated with some chest tightness, shortness of breath and facial swelling. Current meds include trazodone to go to sleep, Alka-Seltzer. No new foods, did state that she was out in her deck with her husband, they found some spiders over the last couple of days which they kill, think it was brown with a spider, cannot recall any bug bites. In the ED given Benadryl, Solu-Medrol, EpiPen, and Pepcid, felt much better, symptoms started to improve, but then developed a delayed reaction with severe acute recurrence  of the rash, chest tightness and facial swelling. She was given a dose of EpiPen, placed on epinephrine drip and transferred to the ICU for closer monitoring.   O:  GEN-awake, alert, appropriate  HEENT-mild eye swelling which is significantly improving CVS-regular rate, no tachycardia LUNGS-clear to auscultation bilaterally, no rales, rhonchi, no wheezing ABD-soft, nontender, nondistended, positive bowel sounds MSK-no tenderness, no swelling anywhere SKIN-no rashes, no indurated areas, no areas of erythema, no puncture wounds, no sites of bug bites   Recent Labs CBC Latest Ref Rng & Units 05/03/2016 03/29/2012  WBC 3.6 - 11.0 K/uL 12.7(H) 9.5  Hemoglobin 12.0 - 16.0 g/dL 14.9 15.7  Hematocrit 35.0 - 47.0 % 43.0 45.4  Platelets 150 - 440 K/uL 343 463(H)      Recent Labs BMP Latest Ref Rng & Units 05/03/2016 03/29/2012  Glucose 65 - 99 mg/dL 148(H) 102(H)  BUN 6 - 20 mg/dL 15 18  Creatinine 0.44 - 1.00 mg/dL 0.69 0.74  Sodium 135 - 145 mmol/L 138  142  Potassium 3.5 - 5.1 mmol/L 3.3(L) 3.6  Chloride 101 - 111 mmol/L 102 108(H)  CO2 22 - 32 mmol/L 28 28  Calcium 8.9 - 10.3 mg/dL 8.6(L) 9.5       (The following images and results were reviewed by Dr. Stevenson Clinch on 05/04/2016). No results found.    A:50 year old female past medical history of anxiety, depression, penicillin allergy now with possible anaphylactic reaction but not anaphylactic shock, significantly improving with medical therapy including epi drip.  P:   -No rashes morning, no pruritus, stated that faces significantly gone down a difficulty with breathing or swallowing at this time, start to wean epi drip. -We'll need a prescription for EpiPen upon discharge -Etiology of anaphylactic reaction unknown, will need follow-up with allergy physician as an outpatient for further allergy testing   .  Rest per NP/medical resident whose note is outlined above and that I agree with  Pulmonary Care Time devoted to patient care  services described in this note is  45 Minutes.   This time reflects time of care of this signee Dr Lorraine Rose.  This critical care time does not reflect procedure time, or teaching time or supervisory time of PA/NP/Med-student/Med Resident etc but could involve care discussion time.  Lorraine Boehringer, MD [facility] Pager [phone] (please enter 7-digits) On Call Pager [phone] (please enter 7-digits)  Note: This note was prepared with Dragon dictation along with smaller phrase technology. Any transcriptional errors that result from this process are unintentional.

## 2016-05-04 NOTE — Progress Notes (Signed)
Subjective: Admitted early this morning for anaphalactic shock. Feels better. Still has some facial and hand swelling.  Objective: Vital signs in last 24 hours: Temp:  [97.8 F (36.6 C)-98.9 F (37.2 C)] 98.3 F (36.8 C) (09/24 1120) Pulse Rate:  [59-96] 74 (09/24 1120) Resp:  [14-28] 17 (09/24 1000) BP: (92-138)/(72-96) 138/92 (09/24 1120) SpO2:  [93 %-100 %] 98 % (09/24 1120) Weight:  [69.7 kg (153 lb 9.6 oz)-73.6 kg (162 lb 4.1 oz)] 73.6 kg (162 lb 4.1 oz) (09/24 0400) Weight change:     Intake/Output from previous day: 09/23 0701 - 09/24 0700 In: 252.7 [I.V.:202.7; IV Piggyback:50] Out: -  Intake/Output this shift: Total I/O In: 549.5 [P.O.:120; I.V.:429.5] Out: -   General appearance: no distress Head: Normocephalic, without obvious abnormality, atraumatic, Mild periorbital edema Resp: clear to auscultation bilaterally and normal percussion bilaterally Cardio: regular rate and rhythm, S1, S2 normal, no murmur, click, rub or gallop  Lab Results:  Recent Labs  05/03/16 2323  WBC 12.7*  HGB 14.9  HCT 43.0  PLT 343   BMET  Recent Labs  05/03/16 2323  NA 138  K 3.3*  CL 102  CO2 28  GLUCOSE 148*  BUN 15  CREATININE 0.69  CALCIUM 8.6*    Studies/Results: No results found.  Medications: I have reviewed the patient's current medications.  Assessment/Plan: 1. Anaphylactic Shock: Now off epi drip. Will continue IV steriods, change benadryl to PO. Not sure what caused this severe reaction. Will need epi pen at discharge. Recommend follow up with allergist for further evaluation post discharge.  Time spent= 20 min  LOS: 0 days   Baxter Hire 05/04/2016, 11:39 AM

## 2016-05-04 NOTE — ED Notes (Signed)
Pt asleep, no difficulty breathing noted, skin appears normal at this time

## 2016-05-05 LAB — PHOSPHORUS: Phosphorus: 4.5 mg/dL (ref 2.5–4.6)

## 2016-05-05 LAB — BASIC METABOLIC PANEL
Anion gap: 7 (ref 5–15)
BUN: 20 mg/dL (ref 6–20)
CO2: 26 mmol/L (ref 22–32)
Calcium: 8.8 mg/dL — ABNORMAL LOW (ref 8.9–10.3)
Chloride: 105 mmol/L (ref 101–111)
Creatinine, Ser: 0.58 mg/dL (ref 0.44–1.00)
GFR calc Af Amer: >60 mL/min (ref >60)
GFR calc non Af Amer: >60 mL/min (ref >60)
Glucose, Bld: 114 mg/dL — ABNORMAL HIGH (ref 65–99)
Potassium: 3.7 mmol/L (ref 3.5–5.1)
Sodium: 138 mmol/L (ref 135–145)

## 2016-05-05 LAB — MAGNESIUM: Magnesium: 2.1 mg/dL (ref 1.7–2.4)

## 2016-05-05 LAB — LACTIC ACID, PLASMA: Lactic Acid, Venous: 1.8 mmol/L (ref 0.5–1.9)

## 2016-05-05 MED ORDER — PREDNISONE 20 MG PO TABS: ORAL_TABLET | ORAL | 0 refills | Status: DC

## 2016-05-05 MED ORDER — K PHOS MONO-SOD PHOS DI & MONO 155-852-130 MG PO TABS
1000.0000 mg | ORAL_TABLET | Freq: Three times a day (TID) | ORAL | Status: DC
  Administered 2016-05-05 (×2): 1000 mg via ORAL
  Filled 2016-05-05 (×2): qty 4

## 2016-05-05 MED ORDER — EPINEPHRINE 0.3 MG/0.3ML IJ SOAJ: 0.3000 mg | Freq: Once | INTRAMUSCULAR | 1 refills | Status: AC

## 2016-05-05 MED ORDER — MAGNESIUM SULFATE 2 GM/50ML IV SOLN
2.0000 g | Freq: Once | INTRAVENOUS | Status: DC
  Filled 2016-05-05: qty 50

## 2016-05-05 NOTE — Discharge Summary (Signed)
[facility] NAME: Lorraine Rose    MR#:  [id number]  DATE OF BIRTH:  11-27-1965  DATE OF ADMISSION:  05/03/2016   ADMITTING PHYSICIAN: Lance Coon, MD  DATE OF DISCHARGE: 05/05/2016 12:33 PM  PRIMARY CARE PHYSICIAN: Dion Body, MD   ADMISSION DIAGNOSIS:  Anaphylaxis, initial encounter [T78.2XXA] DISCHARGE DIAGNOSIS:  Principal Problem:   Anaphylaxis Active Problems:   Anxiety   Depression  SECONDARY DIAGNOSIS:   Past Medical History:  Diagnosis Date  . Allergy   . Anxiety    panic attacks  . Depressed affect    HOSPITAL COURSE:  Patient was admitted on 05/04/16 for anaphylactic reaction to unknown trigger with acute onset of hives, swelling, dyspnea.  She received Solumedrol, Benadryl however symptoms subsequently recurred and was therefore placed on epi drip and admitted to step down for monitoring and closer observation.  She maintained her airway, remained hemodynamically stable and improved over the past 32 hours.  Today on the day of discharge her symptoms are fully resolved.  She has no rash, swelling, dyspnea.   DISCHARGE CONDITIONS:  Stable.  CONSULTS OBTAINED:  Treatment Team:  Hoy Register, MD Baxter Hire, MD DRUG ALLERGIES:   Allergies  Allergen Reactions  . Penicillins Hives, Swelling and Other (See Comments)    Has patient had a PCN reaction causing immediate rash, facial/tongue/throat swelling, SOB or lightheadedness with hypotension: No Has patient had a PCN reaction causing severe rash involving mucus membranes or skin necrosis: No Has patient had a PCN reaction that required hospitalization No Has patient had a PCN reaction occurring within the last 10 years: No If all of the above answers are "NO", then may proceed with Cephalosporin use.    DISCHARGE MEDICATIONS:     Medication List    TAKE these medications   acetaminophen 500 MG tablet Commonly known as:   TYLENOL Take 500 mg by mouth every 4 (four) hours as needed for mild pain, fever or headache.   EPINEPHrine 0.3 mg/0.3 mL Soaj injection Commonly known as:  EPI-PEN Inject 0.3 mLs (0.3 mg total) into the muscle once.   gabapentin 300 MG capsule Commonly known as:  NEURONTIN Take 300 mg by mouth 3 (three) times daily. Reported on 12/03/2015   ibuprofen 200 MG tablet Commonly known as:  ADVIL,MOTRIN Take 800 mg by mouth every 6 (six) hours as needed for headache or mild pain.   PARoxetine 30 MG tablet Commonly known as:  PAXIL Take 30 mg by mouth daily.   predniSONE 20 MG tablet Commonly known as:  DELTASONE Two tabs by mouth for three days then One tab by mouth for three days then One half tab by mouth for three days then STOP   traZODone 100 MG tablet Commonly known as:  DESYREL Take 200 mg by mouth at bedtime.        DISCHARGE INSTRUCTIONS:  Return to ED for any recurrent or concerning symptoms.  Follow up with PMD within three days and call allergist for consultation appointment.   DIET:  Regular DISCHARGE CONDITION:  Regular ACTIVITY:  As tolerated.   OXYGEN:  N/A DISCHARGE LOCATION:  Home.   If you experience worsening of your admission symptoms, develop shortness of breath, life threatening emergency, suicidal or homicidal thoughts you must seek medical attention immediately by calling 911 or calling your MD immediately  if symptoms less severe.  You Must read complete instructions/literature along with all the possible adverse reactions/side effects  for all the Medicines you take and that have been prescribed to you. Take any new Medicines after you have completely understood and accpet all the possible adverse reactions/side effects.   Please note  You were cared for by a hospitalist during your hospital stay. If you have any questions about your discharge medications or the care you received while you were in the hospital after you are discharged, you can call  the unit and asked to speak with the hospitalist on call if the hospitalist that took care of you is not available. Once you are discharged, your primary care physician will handle any further medical issues. Please note that NO REFILLS for any discharge medications will be authorized once you are discharged, as it is imperative that you return to your primary care physician (or establish a relationship with a primary care physician if you do not have one) for your aftercare needs so that they can reassess your need for medications and monitor your lab values.    On the day of Discharge:  VITAL SIGNS:  Blood pressure 140/84, pulse 77, temperature 98 F (36.7 C), temperature source Oral, resp. rate 16, height 5\' 6"  (1.676 m), weight 73.6 kg (162 lb 4.1 oz), last menstrual period 05/02/2016, SpO2 99 %. PHYSICAL EXAMINATION:  GENERAL:  50 y.o.-year-old patient lying in the bed with no acute distress.  EYES: Pupils equal, round, reactive to light and accommodation. No scleral icterus. Extraocular muscles intact.  HEENT: Head atraumatic, normocephalic. Oropharynx and nasopharynx clear. No lip, tongue or pharyngeal edema.  NECK:  Supple, no jugular venous distention. No thyroid enlargement, no tenderness.  LUNGS: Normal breath sounds bilaterally, no wheezing, rales,rhonchi or crepitation. No use of accessory muscles of respiration.  CARDIOVASCULAR: S1, S2 normal. No murmurs, rubs, or gallops.  ABDOMEN: Soft, non-tender, non-distended. Bowel sounds present. No organomegaly or mass.  EXTREMITIES: No pedal edema, cyanosis, or clubbing.  NEUROLOGIC: Cranial nerves II through XII are intact. Muscle strength 5/5 in all extremities. Sensation intact. Gait not checked.  PSYCHIATRIC: The patient is alert and oriented x 3.  SKIN: No obvious rash, lesion, or ulcer.  DATA REVIEW:   CBC  Recent Labs Lab 05/03/16 2323  WBC 12.7*  HGB 14.9  HCT 43.0  PLT 343    Chemistries   Recent Labs Lab  05/03/16 2323  05/05/16 1115  NA 138  --  138  K 3.3*  --  3.7  CL 102  --  105  CO2 28  --  26  GLUCOSE 148*  --  114*  BUN 15  --  20  CREATININE 0.69  --  0.58  CALCIUM 8.6*  --  8.8*  MG  --   < > 2.1  AST 432*  --   --   ALT 174*  --   --   ALKPHOS 62  --   --   BILITOT 0.6  --   --   < > = values in this interval not displayed.   Microbiology Results  Results for orders placed or performed during the hospital encounter of 05/03/16  MRSA PCR Screening     Status: None   Collection Time: 05/04/16  4:07 AM  Result Value Ref Range Status   MRSA by PCR NEGATIVE NEGATIVE Final    Comment:        The GeneXpert MRSA Assay (FDA approved for NASAL specimens only), is one component of a comprehensive MRSA colonization surveillance program. It is not intended to diagnose  MRSA infection nor to guide or monitor treatment for MRSA infections.     RADIOLOGY:  No results found.   Management plans discussed with the patient, family and they are in agreement.  CODE STATUS:  Code Status History    Date Active Date Inactive Code Status Order ID Comments User Context   05/04/2016  3:58 AM 05/05/2016  3:40 PM Full Code [id number]  Lance Coon, MD Inpatient      TOTAL TIME TAKING CARE OF THIS PATIENT: 30 minutes.    Harvie Bridge M.D on 05/05/2016 at 4:39 PM  Between 7am to 6pm - Pager - [phone]  After 6pm go to [email or web address] - Proofreader  [facility]  Office  [phone]  CC: Primary care physician; Dion Body, MD   Note: This dictation was prepared with Dragon dictation along with smaller phrase technology. Any transcriptional errors that result from this process are unintentional.

## 2016-05-05 NOTE — Discharge Instructions (Signed)

## 2016-05-05 NOTE — Care Management Important Message (Signed)
Important Message  Patient Details  Name: Lorraine Rose MRN: [id number] Date of Birth: [birth year]   Medicare Important Message Given:  Yes    Shelbie Ammons, RN 05/05/2016, 9:40 AM

## 2016-05-05 NOTE — Progress Notes (Signed)
Discharge paperwork reviewed with patient who verbalized understanding. Prescriptions sent electronically- patient aware. Patient's husband to transport home.

## 2016-12-18 ENCOUNTER — Other Ambulatory Visit: Payer: Self-pay | Admitting: Family Medicine

## 2016-12-18 DIAGNOSIS — M542 Cervicalgia: Secondary | ICD-10-CM

## 2016-12-26 ENCOUNTER — Encounter: Payer: Self-pay | Admitting: Emergency Medicine

## 2016-12-26 ENCOUNTER — Emergency Department
Admission: EM | Admit: 2016-12-26 | Discharge: 2016-12-26 | Disposition: A | Discharge status: Home / Self Care | Payer: Medicare Other | Attending: Emergency Medicine | Admitting: Emergency Medicine

## 2016-12-26 DIAGNOSIS — M542 Cervicalgia: Secondary | ICD-10-CM | POA: Diagnosis present

## 2016-12-26 DIAGNOSIS — M5030 Other cervical disc degeneration, unspecified cervical region: Secondary | ICD-10-CM | POA: Insufficient documentation

## 2016-12-26 MED ORDER — HYDROMORPHONE HCL 1 MG/ML IJ SOLN
1.0000 mg | Freq: Once | INTRAMUSCULAR | Status: AC
Start: 2016-12-26 — End: 2016-12-26
  Administered 2016-12-26: 1 mg via INTRAMUSCULAR
  Filled 2016-12-26: qty 1

## 2016-12-26 MED ORDER — METHYLPREDNISOLONE SODIUM SUCC 125 MG IJ SOLR
125.0000 mg | Freq: Once | INTRAMUSCULAR | Status: AC
  Administered 2016-12-26: 125 mg via INTRAMUSCULAR
  Filled 2016-12-26: qty 2

## 2016-12-26 MED ORDER — OXYCODONE-ACETAMINOPHEN 7.5-325 MG PO TABS: 1.0000 | ORAL_TABLET | Freq: Four times a day (QID) | ORAL | 0 refills | Status: DC | PRN

## 2016-12-26 MED ORDER — ORPHENADRINE CITRATE 30 MG/ML IJ SOLN
60.0000 mg | Freq: Two times a day (BID) | INTRAMUSCULAR | Status: DC
  Administered 2016-12-26: 60 mg via INTRAMUSCULAR
  Filled 2016-12-26: qty 2

## 2016-12-26 MED ORDER — METHOCARBAMOL 750 MG PO TABS: 1500.0000 mg | ORAL_TABLET | Freq: Four times a day (QID) | ORAL | 0 refills | Status: DC

## 2016-12-26 NOTE — ED Triage Notes (Signed)
Pt to ED via POV for c/o neck pain. Pt states that she has been having neck pain for over 2 weeks, pt has seen her PCP and has MRI scheduled but feels like the pain is getting worse. Pt in NAD at this time.

## 2016-12-26 NOTE — ED Provider Notes (Signed)
[facility] Emergency Department Provider Note   ____________________________________________   First MD Initiated Contact with Patient 12/26/16 1311     (approximate)  I have reviewed the triage vital signs and the nursing notes.   HISTORY  Chief Complaint Neck Pain    HPI Lorraine Rose is a 51 y.o. female patient complaining of increasing neck pain. Patient state pain has increased over the past 2 weeks. Patient has a history of degenerative disc disease of lumbar spine. Patient had x-rays performed by her PCP and he is scheduled an MRI of the cervical spine 3 days. Patient determined also help her pain. Patient states she called her PCP and advised to come to the ED. Patient denies any radicular component to her neck pain. Patient stated pain increases with flexion and extension of the neck.Patient rates the pain as a 9/10. Patient describes the pain as a constant achy intermitting episodes of sharp pain.   Past Medical History:  Diagnosis Date  . Allergy   . Anxiety    panic attacks  . Depressed affect     Patient Active Problem List   Diagnosis Date Noted  . Anaphylaxis 05/04/2016  . Anxiety 05/04/2016  . Depression 05/04/2016  . DDD (degenerative disc disease), lumbar 10/23/2015  . Status post lumbar laminectomy 10/23/2015  . Status post lumbar spinal fusion 10/23/2015  . Facet syndrome, lumbar ([place]) 10/23/2015  . Lumbar radiculopathy 10/23/2015  . 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 Depressed affect     Past Surgical History:  Procedure Laterality Date  . BACK SURGERY     2004, 2006  . CESAREAN SECTION    . CHOLECYSTECTOMY    . NASAL SINUS SURGERY      Prior to Admission medications   Medication Sig Start Date End Date Taking? Authorizing Provider  acetaminophen (TYLENOL) 500 MG tablet Take 500 mg by mouth every 4 (four) hours as needed for mild pain, fever or headache.     [provider]  gabapentin  (NEURONTIN) 300 MG capsule Take 300 mg by mouth 3 (three) times daily. Reported on 12/03/2015 01/15/15   [provider]  ibuprofen (ADVIL,MOTRIN) 200 MG tablet Take 800 mg by mouth every 6 (six) hours as needed for headache or mild pain.    [provider]  methocarbamol (ROBAXIN-750) 750 MG tablet Take 2 tablets (1,500 mg total) by mouth 4 (four) times daily. 12/26/16   Sable Feil, PA-C  oxyCODONE-acetaminophen (PERCOCET) 7.5-325 MG tablet Take 1 tablet by mouth every 6 (six) hours as needed for severe pain. 12/26/16   Sable Feil, PA-C  PARoxetine (PAXIL) 30 MG tablet Take 30 mg by mouth daily.    [provider]  predniSONE (DELTASONE) 20 MG tablet Two tabs by mouth for three days then One tab by mouth for three days then One half tab by mouth for three days then STOP 05/05/16   Hugelmeyer, Alexis, DO  traZODone (DESYREL) 100 MG tablet Take 200 mg by mouth at bedtime.  10/17/15   [provider]    Allergies Penicillins  Family History  Problem Relation Age of Onset  . Heart disease Mother   . Alzheimer's disease Father     Social History Social History  Substance Use Topics  . Smoking status: Current Some Day Smoker    Packs/day: 0.20    Types: Cigarettes  . Smokeless tobacco: Never Used  . Alcohol use No    Review of Systems  Constitutional: No  fever/chills Eyes: No visual changes. ENT: No sore throat. Cardiovascular: Denies chest pain. Respiratory: Denies shortness of breath. Gastrointestinal: No abdominal pain.  No nausea, no vomiting.  No diarrhea.  No constipation. Genitourinary: Negative for dysuria. Musculoskeletal: Positive for neck pain Skin: Negative for rash. Neurological: Negative for headaches, focal weakness or numbness. Psychiatric:Anxiety and depression Allergic/Immunilogical: Penicillin ____________________________________________   PHYSICAL EXAM:  VITAL SIGNS: ED Triage Vitals [12/26/16 1255]  Enc Vitals  Group     BP 140/89     Pulse Rate 91     Resp 16     Temp 98.4 F (36.9 C)     Temp Source Oral     SpO2 100 %     Weight 140 lb (63.5 kg)     Height 5\' 6"  (1.676 m)     Head Circumference      Peak Flow      Pain Score 9     Pain Loc      Pain Edu?      Excl. in [place]?     Constitutional: Alert and oriented. Moderate distress  Neck: No stridor.   Positive cervical spine tenderness to palpation C4-C6 Hematological/Lymphatic/Immunilogical: No cervical lymphadenopathy. Cardiovascular: Normal rate, regular rhythm. Grossly normal heart sounds.  Good peripheral circulation. Respiratory: Normal respiratory effort.  No retractions. Lungs CTAB. Musculoskeletal: No lower extremity tenderness nor edema.  No joint effusions. Neurologic:  Normal speech and language. No gross focal neurologic deficits are appreciated. No gait instability. Skin:  Skin is warm, dry and intact. No rash noted. Psychiatric: Mood and affect are normal. Speech and behavior are normal.  ____________________________________________   LABS (all labs ordered are listed, but only abnormal results are displayed)  Labs Reviewed - No data to display ____________________________________________  EKG   ____________________________________________  RADIOLOGY   ____________________________________________   PROCEDURES  Procedure(s) performed: None  Procedures  Critical Care performed: No  ____________________________________________   INITIAL IMPRESSION / ASSESSMENT AND PLAN / ED COURSE  Pertinent labs & imaging results that were available during my care of the patient were reviewed by me and considered in my medical decision making (see chart for details).  Neck pain secondary to degenerative disc disease of cervical spine. Patient given discharge care instructions. Patient advised to follow-up with scheduled MRI 3 days.      ____________________________________________   FINAL CLINICAL  IMPRESSION(S) / ED DIAGNOSES  Final diagnoses:  DDD (degenerative disc disease), cervical      NEW MEDICATIONS STARTED DURING THIS VISIT:  New Prescriptions   METHOCARBAMOL (ROBAXIN-750) 750 MG TABLET    Take 2 tablets (1,500 mg total) by mouth 4 (four) times daily.   OXYCODONE-ACETAMINOPHEN (PERCOCET) 7.5-325 MG TABLET    Take 1 tablet by mouth every 6 (six) hours as needed for severe pain.     Note:  This document was prepared using Dragon voice recognition software and may include unintentional dictation errors.    Sable Feil, PA-C 12/26/16 1340    Lisa Roca, MD 12/26/16 [phone]

## 2016-12-26 NOTE — ED Notes (Signed)
Pt ride here, in pts room.

## 2016-12-29 ENCOUNTER — Ambulatory Visit
Admission: RE | Admit: 2016-12-29 | Discharge: 2016-12-29 | Disposition: A | Discharge status: Home / Self Care | Payer: Medicare Other | Source: Ambulatory Visit | Attending: Family Medicine | Admitting: Family Medicine

## 2016-12-29 DIAGNOSIS — M542 Cervicalgia: Secondary | ICD-10-CM

## 2016-12-29 DIAGNOSIS — M50322 Other cervical disc degeneration at C5-C6 level: Secondary | ICD-10-CM | POA: Diagnosis not present

## 2016-12-29 DIAGNOSIS — M4802 Spinal stenosis, cervical region: Secondary | ICD-10-CM | POA: Insufficient documentation

## 2016-12-31 ENCOUNTER — Ambulatory Visit: Payer: Medicare Other

## 2017-01-05 ENCOUNTER — Emergency Department
Admission: EM | Admit: 2017-01-05 | Discharge: 2017-01-05 | Disposition: A | Discharge status: Home / Self Care | Payer: Medicare Other | Attending: Emergency Medicine | Admitting: Emergency Medicine

## 2017-01-05 ENCOUNTER — Encounter: Payer: Self-pay | Admitting: Emergency Medicine

## 2017-01-05 DIAGNOSIS — F17210 Nicotine dependence, cigarettes, uncomplicated: Secondary | ICD-10-CM | POA: Insufficient documentation

## 2017-01-05 DIAGNOSIS — M5090 Cervical disc disorder, unspecified, unspecified cervical region: Secondary | ICD-10-CM

## 2017-01-05 DIAGNOSIS — M542 Cervicalgia: Secondary | ICD-10-CM | POA: Diagnosis present

## 2017-01-05 DIAGNOSIS — Z79899 Other long term (current) drug therapy: Secondary | ICD-10-CM | POA: Insufficient documentation

## 2017-01-05 DIAGNOSIS — M50922 Unspecified cervical disc disorder at C5-C6 level: Secondary | ICD-10-CM | POA: Diagnosis not present

## 2017-01-05 DIAGNOSIS — G8929 Other chronic pain: Secondary | ICD-10-CM | POA: Diagnosis not present

## 2017-01-05 MED ORDER — OXYCODONE-ACETAMINOPHEN 5-325 MG PO TABS: 1.0000 | ORAL_TABLET | Freq: Four times a day (QID) | ORAL | 0 refills | Status: AC | PRN

## 2017-01-05 MED ORDER — DEXAMETHASONE SODIUM PHOSPHATE 10 MG/ML IJ SOLN
10.0000 mg | Freq: Once | INTRAMUSCULAR | Status: AC
  Administered 2017-01-05: 10 mg via INTRAMUSCULAR
  Filled 2017-01-05: qty 1

## 2017-01-05 NOTE — ED Provider Notes (Signed)
[facility] Emergency Department Provider Note   ____________________________________________   I have reviewed the triage vital signs and the nursing notes.   HISTORY  Chief Complaint Medication Refill    HPI Lorraine Rose is a 51 y.o. female presents to the emergency department this morning with increasing neck pain 1 month. Patient reports radiating pain down the left arm however denies weakness. Patient is under the care of [facility] for pain management until surgery she is currently out of her pain medication and is unable to reach him due to the holiday and is here for pain management. Patient is scheduled for a neurosurgery consultation with [facility] neuro[facility] on June 5. Patient denies fever, chills, headache, vision changes, chest pain, chest tightness, shortness of breath, abdominal pain, nausea and vomiting.  Past Medical History:  Diagnosis Date  . Allergy   . Anxiety    panic attacks  . Depressed affect     Patient Active Problem List   Diagnosis Date Noted  . Anaphylaxis 05/04/2016  . Anxiety 05/04/2016  . Depression 05/04/2016  . DDD (degenerative disc disease), lumbar 10/23/2015  . Status post lumbar laminectomy 10/23/2015  . Status post lumbar spinal fusion 10/23/2015  . Facet syndrome, lumbar ([place]) 10/23/2015  . Lumbar radiculopathy 10/23/2015  . 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 Depressed affect     Past Surgical History:  Procedure Laterality Date  . BACK SURGERY     2004, 2006  . CESAREAN SECTION    . CHOLECYSTECTOMY    . NASAL SINUS SURGERY      Prior to Admission medications   Medication Sig Start Date End Date Taking? Authorizing Provider  acetaminophen (TYLENOL) 500 MG tablet Take 500 mg by mouth every 4 (four) hours as needed for mild pain, fever or headache.     [provider]  gabapentin (NEURONTIN) 300 MG capsule Take 300 mg by mouth 3 (three) times daily. Reported on 12/03/2015 01/15/15    [provider]  ibuprofen (ADVIL,MOTRIN) 200 MG tablet Take 800 mg by mouth every 6 (six) hours as needed for headache or mild pain.    [provider]  methocarbamol (ROBAXIN-750) 750 MG tablet Take 2 tablets (1,500 mg total) by mouth 4 (four) times daily. 12/26/16   Sable Feil, PA-C  oxyCODONE-acetaminophen (PERCOCET) 7.5-325 MG tablet Take 1 tablet by mouth every 6 (six) hours as needed for severe pain. 12/26/16   Sable Feil, PA-C  oxyCODONE-acetaminophen (ROXICET) 5-325 MG tablet Take 1 tablet by mouth every 6 (six) hours as needed. 01/05/17 01/05/18  Little, Traci M, PA-C  PARoxetine (PAXIL) 30 MG tablet Take 30 mg by mouth daily.    [provider]  predniSONE (DELTASONE) 20 MG tablet Two tabs by mouth for three days then One tab by mouth for three days then One half tab by mouth for three days then STOP 05/05/16   Hugelmeyer, Alexis, DO  traZODone (DESYREL) 100 MG tablet Take 200 mg by mouth at bedtime.  10/17/15   [provider]    Allergies Penicillins  Family History  Problem Relation Age of Onset  . Heart disease Mother   . Alzheimer's disease Father     Social History Social History  Substance Use Topics  . Smoking status: Current Some Day Smoker    Packs/day: 0.20    Types: Cigarettes  . Smokeless tobacco: Never Used  . Alcohol use No    Review of Systems Constitutional: Negative for fever/chills Eyes: No  visual changes. Respiratory: Denies cough Denies shortness of breath. Gastrointestinal: No abdominal pain.  No nausea, vomiting, diarrhea. Musculoskeletal: Positive for cervical neck pain and right upper charity radiculopathy. Negative for back pain.  Skin: for rash. Neurological: Negative for headaches.  Negative focal weakness or numbness. ____________________________________________   PHYSICAL EXAM:  VITAL SIGNS: ED Triage Vitals  Enc Vitals Group     BP 01/05/17 0846 129/88     Pulse Rate 01/05/17 0846 93      Resp 01/05/17 0846 16     Temp 01/05/17 0846 97.9 F (36.6 C)     Temp Source 01/05/17 0846 Oral     SpO2 01/05/17 0846 98 %     Weight 01/05/17 0844 140 lb (63.5 kg)     Height 01/05/17 0844 5\' 6"  (1.676 m)     Head Circumference --      Peak Flow --      Pain Score 01/05/17 0844 8     Pain Loc --      Pain Edu? --      Excl. in [place]? --     Constitutional: Alert and oriented. Well appearing and in no acute distress.  Head: Normocephalic and atraumatic. Eyes: Conjunctivae are normal.  Mouth/Throat: Mucous membranes are moist. Cardiovascular: Normal rate, regular rhythm. Normal distal pulses. Respiratory: Normal respiratory effort. Musculoskeletal: Nontender with normal range of motion in all extremities. Cervical range of motion within functional limits with pain however unchanged from her baseline. Left upper extremity sensory and strength intact. Neurologic: Normal speech and language. No gross focal neurologic deficits are appreciated. No gait instability. No sensory loss or abnormal reflexes.  Skin:  Skin is warm, dry and intact. No rash noted. Psychiatric: Mood and affect are normal.  ____________________________________________   LABS (all labs ordered are listed, but only abnormal results are displayed)  Labs Reviewed - No data to display ____________________________________________  EKG None ____________________________________________  RADIOLOGY None ____________________________________________   PROCEDURES  Procedure(s) performed: No    Critical Care performed: no ____________________________________________   INITIAL IMPRESSION / ASSESSMENT AND PLAN / ED COURSE  Pertinent labs & imaging results that were available during my care of the patient were reviewed by me and considered in my medical decision making (see chart for details).   Patient presents with ongoing cervical neck pain with radiating left upper extremity pain that has worsened over the  past month. Patient has a history of degenerative disc disease affecting C5-6 segment. Patient is currently out of her pain management prescription and her physician's office is closed today. Based on physical exam findings and history patient will be provided a 5 day prescription for pain management, oxycodone. Patient given Decadron 10 mg IM for inflammation and and pain. Patient noted reduction of pain and symptoms following injection.  Patient informed of clinical course, understand medical decision-making process, and agree with plan. Patient was highly encouraged to attend her [facility] neurosurgery appointment and was also advised to return to the emergency department for symptoms that change or worsen.      If controlled substance prescribed during this visit, 12 month history viewed on the [place] prior to issuing an initial prescription for Schedule II or III opiod. ____________________________________________   FINAL CLINICAL IMPRESSION(S) / ED DIAGNOSES  Final diagnoses:  Cervical neck pain with evidence of disc disease  Other chronic pain       NEW MEDICATIONS STARTED DURING THIS VISIT:  New Prescriptions   OXYCODONE-ACETAMINOPHEN (ROXICET) 5-325 MG TABLET    Take  1 tablet by mouth every 6 (six) hours as needed.     Note:  This document was prepared using Dragon voice recognition software and may include unintentional dictation errors.    Jerolyn Shin, PA-C 01/05/17 1029    Harvest Dark, MD 01/05/17 1402

## 2017-01-05 NOTE — ED Notes (Signed)
See triage note  States she is requesting a refill on pain meds until she can be seen by her PCP

## 2017-01-05 NOTE — ED Triage Notes (Signed)
Pt reports neck pain x1 month, had recent MRI and has been seeing [facility] for pain management until surgery. Pt reports she ran out of pain medication and today is a holiday.

## 2017-01-20 ENCOUNTER — Other Ambulatory Visit: Payer: Self-pay | Admitting: Neurological Surgery

## 2017-01-20 DIAGNOSIS — M5412 Radiculopathy, cervical region: Secondary | ICD-10-CM

## 2017-02-03 ENCOUNTER — Inpatient Hospital Stay
Admission: RE | Admit: 2017-02-03 | Discharge: 2017-02-03 | Disposition: A | Discharge status: Home / Self Care | Payer: Medicare Other | Source: Ambulatory Visit | Attending: Neurological Surgery | Admitting: Neurological Surgery

## 2017-02-06 ENCOUNTER — Ambulatory Visit
Admission: RE | Admit: 2017-02-06 | Discharge: 2017-02-06 | Disposition: A | Discharge status: Home / Self Care | Payer: Medicare Other | Source: Ambulatory Visit | Attending: Neurological Surgery | Admitting: Neurological Surgery

## 2017-02-06 DIAGNOSIS — M5412 Radiculopathy, cervical region: Secondary | ICD-10-CM

## 2017-02-06 MED ORDER — IOPAMIDOL (ISOVUE-M 300) INJECTION 61%
1.0000 mL | Freq: Once | INTRAMUSCULAR | Status: AC | PRN
  Administered 2017-02-06: 1 mL via EPIDURAL

## 2017-02-06 MED ORDER — TRIAMCINOLONE ACETONIDE 40 MG/ML IJ SUSP (RADIOLOGY)
60.0000 mg | Freq: Once | INTRAMUSCULAR | Status: AC
  Administered 2017-02-06: 60 mg via EPIDURAL

## 2017-02-06 NOTE — Discharge Instructions (Signed)

## 2017-05-25 ENCOUNTER — Encounter: Payer: Self-pay | Admitting: Emergency Medicine

## 2017-05-25 ENCOUNTER — Emergency Department
Admission: EM | Admit: 2017-05-25 | Discharge: 2017-05-25 | Disposition: A | Discharge status: Home / Self Care | Payer: Medicare Other | Attending: Emergency Medicine | Admitting: Emergency Medicine

## 2017-05-25 DIAGNOSIS — S161XXA Strain of muscle, fascia and tendon at neck level, initial encounter: Secondary | ICD-10-CM

## 2017-05-25 DIAGNOSIS — F17210 Nicotine dependence, cigarettes, uncomplicated: Secondary | ICD-10-CM | POA: Diagnosis not present

## 2017-05-25 DIAGNOSIS — Z79899 Other long term (current) drug therapy: Secondary | ICD-10-CM | POA: Insufficient documentation

## 2017-05-25 DIAGNOSIS — M542 Cervicalgia: Secondary | ICD-10-CM | POA: Diagnosis present

## 2017-05-25 MED ORDER — MORPHINE SULFATE (PF) 4 MG/ML IV SOLN
4.0000 mg | Freq: Once | INTRAVENOUS | Status: AC
  Administered 2017-05-25: 4 mg via INTRAMUSCULAR

## 2017-05-25 MED ORDER — KETOROLAC TROMETHAMINE 30 MG/ML IJ SOLN
30.0000 mg | Freq: Once | INTRAMUSCULAR | Status: AC
  Administered 2017-05-25: 30 mg via INTRAMUSCULAR

## 2017-05-25 MED ORDER — MORPHINE SULFATE (PF) 4 MG/ML IV SOLN
INTRAVENOUS | Status: AC
  Filled 2017-05-25: qty 1

## 2017-05-25 MED ORDER — ONDANSETRON 4 MG PO TBDP
4.0000 mg | ORAL_TABLET | Freq: Once | ORAL | Status: AC
  Administered 2017-05-25: 4 mg via ORAL

## 2017-05-25 MED ORDER — KETOROLAC TROMETHAMINE 30 MG/ML IJ SOLN
INTRAMUSCULAR | Status: AC
  Filled 2017-05-25: qty 1

## 2017-05-25 MED ORDER — ONDANSETRON 4 MG PO TBDP
ORAL_TABLET | ORAL | Status: AC
  Filled 2017-05-25: qty 1

## 2017-05-25 NOTE — ED Provider Notes (Signed)
[facility] Emergency Department Provider Note  ____________________________________________   First MD Initiated Contact with Patient 05/25/17 2240     (approximate)  I have reviewed the triage vital signs and the nursing notes.   HISTORY  Chief Complaint Neck Injury   HPI Lorraine Rose is a 51 y.o. female with a history of degenerative disc disease scheduled for a C4-C5 surgery next week who is presenting to the emergency department with neck pain. She says that she had a chiropractor appointment this morning at 11:30 AM where she underwent multiple neck adjustments. Says that ever since then she has had worsening pain with movement of the neck. She says that she also is aching now to the left shoulder and arm. Says that she has chronic tingling to the left upper extremity which is unchanged. Says that she has tried her home meds oxycodone, round and Flexeril without relief. Denies any weakness. Says that she is a follow-up appointment tomorrow with her neurosurgeon.patient reports pain being to the midline of the C-spine.   Past Medical History:  Diagnosis Date  . Allergy   . Anxiety    panic attacks  . Depressed affect     Patient Active Problem List   Diagnosis Date Noted  . Anaphylaxis 05/04/2016  . Anxiety 05/04/2016  . Depression 05/04/2016  . DDD (degenerative disc disease), lumbar 10/23/2015  . Status post lumbar laminectomy 10/23/2015  . Status post lumbar spinal fusion 10/23/2015  . Facet syndrome, lumbar 10/23/2015  . Lumbar radiculopathy 10/23/2015  . 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 Depressed affect     Past Surgical History:  Procedure Laterality Date  . BACK SURGERY     2004, 2006  . CESAREAN SECTION    . CHOLECYSTECTOMY    . NASAL SINUS SURGERY      Prior to Admission medications   Medication Sig Start Date End Date Taking? Authorizing Provider  acetaminophen (TYLENOL) 500 MG tablet Take 500 mg by mouth  every 4 (four) hours as needed for mild pain, fever or headache.     [provider]  gabapentin (NEURONTIN) 300 MG capsule Take 300 mg by mouth 3 (three) times daily. Reported on 12/03/2015 01/15/15   [provider]  ibuprofen (ADVIL,MOTRIN) 200 MG tablet Take 800 mg by mouth every 6 (six) hours as needed for headache or mild pain.    [provider]  methocarbamol (ROBAXIN-750) 750 MG tablet Take 2 tablets (1,500 mg total) by mouth 4 (four) times daily. 12/26/16   Sable Feil, PA-C  oxyCODONE-acetaminophen (PERCOCET) 7.5-325 MG tablet Take 1 tablet by mouth every 6 (six) hours as needed for severe pain. 12/26/16   Sable Feil, PA-C  oxyCODONE-acetaminophen (ROXICET) 5-325 MG tablet Take 1 tablet by mouth every 6 (six) hours as needed. 01/05/17 01/05/18  Little, Traci M, PA-C  PARoxetine (PAXIL) 30 MG tablet Take 30 mg by mouth daily.    [provider]  predniSONE (DELTASONE) 20 MG tablet Two tabs by mouth for three days then One tab by mouth for three days then One half tab by mouth for three days then STOP 05/05/16   Hugelmeyer, Alexis, DO  traZODone (DESYREL) 100 MG tablet Take 200 mg by mouth at bedtime.  10/17/15   [provider]    Allergies Penicillins  Family History  Problem Relation Age of Onset  . Heart disease Mother   . Alzheimer's disease Father     Social History Social History  Substance  Use Topics  . Smoking status: Current Some Day Smoker    Packs/day: 0.20    Types: Cigarettes  . Smokeless tobacco: Never Used  . Alcohol use No    Review of Systems  Constitutional: No fever/chills Eyes: No visual changes. ENT: No sore throat. Cardiovascular: Denies chest pain. Respiratory: Denies shortness of breath. Gastrointestinal: No abdominal pain.  No nausea, no vomiting.  No diarrhea.  No constipation. Genitourinary: Negative for dysuria. Musculoskeletal: Negative for back pain. Skin: Negative for rash. Neurological:  Negative for headaches, focal weakness    ____________________________________________   PHYSICAL EXAM:  VITAL SIGNS: ED Triage Vitals [05/25/17 2058]  Enc Vitals Group     BP 140/89     Pulse Rate 79     Resp 16     Temp 98 F (36.7 C)     Temp Source Oral     SpO2 99 %     Weight      Height      Head Circumference      Peak Flow      Pain Score      Pain Loc      Pain Edu?      Excl. in [place]?     Constitutional: Alert and oriented. Well appearing and in no acute distress. Eyes: Conjunctivae are normal.  Head: Atraumatic. Nose: No congestion/rhinnorhea. Mouth/Throat: Mucous membranes are moist.  Neck: No stridor.  no tenderness to the midline cervical spine. No deformity or step-off. Patient able to range her neck but with restriction secondary to pain. Cardiovascular: Normal rate, regular rhythm. Grossly normal heart sounds.  Good peripheral circulation with equal and bilateral radial pulses. Respiratory: Normal respiratory effort.  No retractions. Lungs CTAB. Gastrointestinal: Soft and nontender. No distention. No CVA tenderness. Musculoskeletal: No lower extremity tenderness nor edema.  No joint effusions.5 out of 5 strength bilateral upper extremity is. Neurologic:  Normal speech and language. No gross focal neurologic deficits are appreciated. Skin:  Skin is warm, dry and intact. No rash noted. Psychiatric: Mood and affect are normal. Speech and behavior are normal.  ____________________________________________   LABS (all labs ordered are listed, but only abnormal results are displayed)  Labs Reviewed - No data to display ____________________________________________  EKG   ____________________________________________  RADIOLOGY   ____________________________________________   PROCEDURES  Procedure(s) performed:   Procedures  Critical Care performed:   ____________________________________________   INITIAL IMPRESSION / ASSESSMENT AND PLAN /  ED COURSE  Pertinent labs & imaging results that were available during my care of the patient were reviewed by me and considered in my medical decision making (see chart for details).  DX: Muscle spasm, C-spine fracture, radiculopathy  As part of my medical decision making, I reviewed the following data within the [place] Notes from prior ED visits     ----------------------------------------- 10:51 PM on 05/25/2017 -----------------------------------------  Patient now says that her pain is significantly reduced. She is able to range her head fully with only her baseline level of pain which she says is normal for her. Continues to be nontender to palpation per patient will be discharged home at this time and will follow-up with her neurosurgeon tomorrow. Because of a very reassuring exam as well as no neurologic deficits appreciated just to defer imaging at this time. Patient is understanding of the diagnosis as well as the plan and willing to comply. Will be discharged home.  ____________________________________________   FINAL CLINICAL IMPRESSION(S) / ED DIAGNOSES  cervical spine pain  NEW MEDICATIONS STARTED DURING THIS VISIT:  New Prescriptions   No medications on file     Note:  This document was prepared using Dragon voice recognition software and may include unintentional dictation errors.     Orbie Pyo, MD 05/25/17 2252

## 2017-05-25 NOTE — ED Triage Notes (Signed)
Pt c/o neck pain since leaving chiropractors office this evening. Pt reports she has a ruptured disc at C4/C5 and is due for surgery next Thursday but after today's chiropractor visit, her neck pain has increased and is not managed by home medication of oxycodone, flexeril and gabapentin.

## 2017-12-10 ENCOUNTER — Other Ambulatory Visit: Payer: Self-pay | Admitting: Family Medicine

## 2017-12-10 DIAGNOSIS — M5416 Radiculopathy, lumbar region: Secondary | ICD-10-CM

## 2017-12-15 ENCOUNTER — Ambulatory Visit
Admission: RE | Admit: 2017-12-15 | Discharge: 2017-12-15 | Disposition: A | Discharge status: Home / Self Care | Payer: Medicare PPO | Source: Ambulatory Visit | Attending: Family Medicine | Admitting: Family Medicine

## 2017-12-15 DIAGNOSIS — M2578 Osteophyte, vertebrae: Secondary | ICD-10-CM | POA: Diagnosis not present

## 2017-12-15 DIAGNOSIS — M47816 Spondylosis without myelopathy or radiculopathy, lumbar region: Secondary | ICD-10-CM | POA: Insufficient documentation

## 2017-12-15 DIAGNOSIS — M5126 Other intervertebral disc displacement, lumbar region: Secondary | ICD-10-CM | POA: Insufficient documentation

## 2017-12-15 DIAGNOSIS — M5416 Radiculopathy, lumbar region: Secondary | ICD-10-CM

## 2017-12-15 DIAGNOSIS — M5127 Other intervertebral disc displacement, lumbosacral region: Secondary | ICD-10-CM | POA: Diagnosis not present

## 2017-12-15 DIAGNOSIS — Z9889 Other specified postprocedural states: Secondary | ICD-10-CM | POA: Insufficient documentation

## 2017-12-17 ENCOUNTER — Encounter: Payer: Self-pay | Admitting: Emergency Medicine

## 2017-12-17 ENCOUNTER — Emergency Department: Payer: Medicare PPO

## 2017-12-17 ENCOUNTER — Emergency Department
Admission: EM | Admit: 2017-12-17 | Discharge: 2017-12-17 | Disposition: A | Discharge status: Home / Self Care | Payer: Medicare PPO | Attending: Emergency Medicine | Admitting: Emergency Medicine

## 2017-12-17 ENCOUNTER — Other Ambulatory Visit: Payer: Self-pay

## 2017-12-17 DIAGNOSIS — J209 Acute bronchitis, unspecified: Secondary | ICD-10-CM

## 2017-12-17 DIAGNOSIS — J219 Acute bronchiolitis, unspecified: Secondary | ICD-10-CM | POA: Diagnosis not present

## 2017-12-17 DIAGNOSIS — R079 Chest pain, unspecified: Secondary | ICD-10-CM | POA: Diagnosis not present

## 2017-12-17 DIAGNOSIS — F17210 Nicotine dependence, cigarettes, uncomplicated: Secondary | ICD-10-CM | POA: Diagnosis not present

## 2017-12-17 LAB — CBC
HCT: 41.7 % (ref 35.0–47.0)
Hemoglobin: 14.4 g/dL (ref 12.0–16.0)
MCH: 32.2 pg (ref 26.0–34.0)
MCHC: 34.5 g/dL (ref 32.0–36.0)
MCV: 93.1 fL (ref 80.0–100.0)
Platelets: 320 10*3/uL (ref 150–440)
RBC: 4.48 MIL/uL (ref 3.80–5.20)
RDW: 14.2 % (ref 11.5–14.5)
WBC: 8 10*3/uL (ref 3.6–11.0)

## 2017-12-17 LAB — BASIC METABOLIC PANEL
Anion gap: 7 (ref 5–15)
BUN: 12 mg/dL (ref 6–20)
CO2: 27 mmol/L (ref 22–32)
Calcium: 8.6 mg/dL — ABNORMAL LOW (ref 8.9–10.3)
Chloride: 103 mmol/L (ref 101–111)
Creatinine, Ser: 0.72 mg/dL (ref 0.44–1.00)
GFR calc Af Amer: >60 mL/min (ref >60)
GFR calc non Af Amer: >60 mL/min (ref >60)
Glucose, Bld: 95 mg/dL (ref 65–99)
Potassium: 3.4 mmol/L — ABNORMAL LOW (ref 3.5–5.1)
Sodium: 137 mmol/L (ref 135–145)

## 2017-12-17 LAB — TROPONIN I
Troponin I: <0.03 ng/mL (ref <0.03)
Troponin I: <0.03 ng/mL (ref <0.03)

## 2017-12-17 MED ORDER — PREDNISONE 20 MG PO TABS
60.0000 mg | ORAL_TABLET | Freq: Once | ORAL | Status: AC
  Administered 2017-12-17: 60 mg via ORAL
  Filled 2017-12-17: qty 3

## 2017-12-17 MED ORDER — IPRATROPIUM-ALBUTEROL 0.5-2.5 (3) MG/3ML IN SOLN
3.0000 mL | Freq: Once | RESPIRATORY_TRACT | Status: AC
  Administered 2017-12-17: 3 mL via RESPIRATORY_TRACT
  Filled 2017-12-17: qty 3

## 2017-12-17 MED ORDER — PREDNISONE 10 MG (21) PO TBPK: ORAL_TABLET | Freq: Every day | ORAL | 0 refills | Status: DC

## 2017-12-17 NOTE — ED Provider Notes (Addendum)
[facility] Emergency Department Provider Note       Time seen: ----------------------------------------- 1:00 PM on 12/17/2017 -----------------------------------------   I have reviewed the triage vital signs and the nursing notes.  HISTORY   Chief Complaint Chest Pain   HPI Lorraine Rose is a 52 y.o. female with a history of allergies, anxiety, degenerative disc disease who presents to the ED for chest pain and shortness of breath since last night.  Shortness of breath is worse today.  She denies fevers, chills, vomiting, diarrhea.  She states she does have seasonal allergies, has been wheezing.  She used an albuterol inhaler today.  Past Medical History:  Diagnosis Date  . Allergy   . Anxiety    panic attacks  . Depressed affect     Patient Active Problem List   Diagnosis Date Noted  . Anaphylaxis 05/04/2016  . Anxiety 05/04/2016  . Depression 05/04/2016  . DDD (degenerative disc disease), lumbar 10/23/2015  . Status post lumbar laminectomy 10/23/2015  . Status post lumbar spinal fusion 10/23/2015  . Facet syndrome, lumbar 10/23/2015  . Lumbar radiculopathy 10/23/2015  . 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 Depressed affect     Past Surgical History:  Procedure Laterality Date  . BACK SURGERY     2004, 2006  . CESAREAN SECTION    . CHOLECYSTECTOMY    . NASAL SINUS SURGERY      Allergies Penicillins  Social History Social History   Tobacco Use  . Smoking status: Current Some Day Smoker    Packs/day: 0.20    Types: Cigarettes  . Smokeless tobacco: Never Used  Substance Use Topics  . Alcohol use: No    Alcohol/week: 0.0 oz  . Drug use: No    Comment: h/o opioid abuse   Review of Systems Constitutional: Negative for fever. Cardiovascular: Positive for chest pain Respiratory: Positive for shortness of breath Gastrointestinal: Negative for abdominal pain, vomiting and diarrhea. Genitourinary: Negative for  dysuria. Musculoskeletal: Negative for back pain. Skin: Negative for rash. Neurological: Negative for headaches, focal weakness or numbness.  All systems negative/normal/unremarkable except as stated in the HPI  ____________________________________________   PHYSICAL EXAM:  VITAL SIGNS: ED Triage Vitals  Enc Vitals Group     BP 12/17/17 0846 121/73     Pulse Rate 12/17/17 0846 76     Resp 12/17/17 0846 16     Temp 12/17/17 0846 98.3 F (36.8 C)     Temp Source 12/17/17 0846 Oral     SpO2 12/17/17 0846 96 %     Weight 12/17/17 0847 150 lb (68 kg)     Height 12/17/17 0847 5\' 6"  (1.676 m)     Head Circumference --      Peak Flow --      Pain Score 12/17/17 0851 0     Pain Loc --      Pain Edu? --      Excl. in [place]? --    Constitutional: Alert and oriented. Well appearing and in no distress. Eyes: Conjunctivae are normal. Normal extraocular movements. ENT   Head: Normocephalic and atraumatic.   Nose: No congestion/rhinnorhea.   Mouth/Throat: Mucous membranes are moist.   Neck: No stridor. Cardiovascular: Normal rate, regular rhythm. No murmurs, rubs, or gallops. Respiratory: Normal respiratory effort without tachypnea nor retractions.  Bilateral wheezing Gastrointestinal: Soft and nontender. Normal bowel sounds Musculoskeletal: Nontender with normal range of motion in extremities. No lower extremity tenderness nor edema. Neurologic:  Normal speech and language.  No gross focal neurologic deficits are appreciated.  Skin:  Skin is warm, dry and intact. No rash noted. Psychiatric: Depressed mood and affect ____________________________________________  EKG: Interpreted by me.  Sinus rhythm the rate of 80 bpm, normal PR interval, normal QRS, normal QT.  ____________________________________________  ED COURSE:  As part of my medical decision making, I reviewed the following data within the  History obtained from family if available,  nursing notes, old chart and ekg, as well as notes from prior ED visits. Patient presented for chest pain shortness of breath, we will assess with labs and imaging as indicated at this time.   Procedures ____________________________________________   LABS (pertinent positives/negatives)  Labs Reviewed  BASIC METABOLIC PANEL - Abnormal; Notable for the following components:      Result Value   Potassium 3.4 (*)    Calcium 8.6 (*)    All other components within normal limits  CBC  TROPONIN I  TROPONIN I  FIBRIN DERIVATIVES D-DIMER ([facility] ONLY)  POC URINE PREG, ED    RADIOLOGY Images were viewed by me  Chest x-ray is normal  ____________________________________________  DIFFERENTIAL DIAGNOSIS   Seasonal allergy, bronchitis, smoking-related changes, asthma, unstable angina unlikely, PE unlikely  FINAL ASSESSMENT AND PLAN  Chest pain, bronchospasm   Plan: The patient had presented for chest pain or shortness of breath. Patient's labs were reassuring. Patient's imaging was also reassuring, she was given a DuoNeb here as well as some oral prednisone and appeared to feel and breathe better.  She is cleared for outpatient follow-up with a steroid taper.   Laurence Aly, MD   Note: This note was generated in part or whole with voice recognition software. Voice recognition is usually quite accurate but there are transcription errors that can and very often do occur. I apologize for any typographical errors that were not detected and corrected.     Earleen Newport, MD 12/17/17 1303    Earleen Newport, MD 12/17/17 [phone]

## 2017-12-17 NOTE — ED Triage Notes (Signed)
Here for intermittent chest tightness starting couple days ago.  Today felt very SHOB. Only pain is when trying to take deep breath. No birth control or travel, is a smoker.  Unlabored, VSS at this time.  Color WNL.

## 2017-12-17 NOTE — ED Notes (Signed)
First Nurse Note:  Patient in [place], verbalizes her displeasure at having to wait, states "I saw another patient be taken back who came in with the exact same symptoms".  Reassured patient that she would be placed in a bed as soon as one became available.

## 2017-12-17 NOTE — ED Notes (Signed)
First Nurse Note:  Patient complaining of SHOB and CP since last PM, SHOB worse today.  Resp unlabored, 02Sat 96% on room air.  EKG done and shown to Dr. Alfred Levins.

## 2018-01-28 ENCOUNTER — Other Ambulatory Visit: Payer: Self-pay

## 2018-01-28 ENCOUNTER — Inpatient Hospital Stay
Admission: EM | Admit: 2018-01-28 | Discharge: 2018-01-29 | DRG: 176 | Disposition: A | Discharge status: Home / Self Care | Payer: Medicare PPO | Attending: Internal Medicine | Admitting: Internal Medicine

## 2018-01-28 ENCOUNTER — Emergency Department: Payer: Medicare PPO

## 2018-01-28 DIAGNOSIS — S32048A Other fracture of fourth lumbar vertebra, initial encounter for closed fracture: Secondary | ICD-10-CM | POA: Diagnosis present

## 2018-01-28 DIAGNOSIS — I82409 Acute embolism and thrombosis of unspecified deep veins of unspecified lower extremity: Secondary | ICD-10-CM

## 2018-01-28 DIAGNOSIS — F329 Major depressive disorder, single episode, unspecified: Secondary | ICD-10-CM | POA: Diagnosis present

## 2018-01-28 DIAGNOSIS — Z79899 Other long term (current) drug therapy: Secondary | ICD-10-CM

## 2018-01-28 DIAGNOSIS — Z981 Arthrodesis status: Secondary | ICD-10-CM

## 2018-01-28 DIAGNOSIS — Z9049 Acquired absence of other specified parts of digestive tract: Secondary | ICD-10-CM

## 2018-01-28 DIAGNOSIS — Z820 Family history of epilepsy and other diseases of the nervous system: Secondary | ICD-10-CM

## 2018-01-28 DIAGNOSIS — Z716 Tobacco abuse counseling: Secondary | ICD-10-CM | POA: Diagnosis not present

## 2018-01-28 DIAGNOSIS — Z880 Allergy status to penicillin: Secondary | ICD-10-CM | POA: Diagnosis not present

## 2018-01-28 DIAGNOSIS — G894 Chronic pain syndrome: Secondary | ICD-10-CM | POA: Diagnosis present

## 2018-01-28 DIAGNOSIS — F17210 Nicotine dependence, cigarettes, uncomplicated: Secondary | ICD-10-CM | POA: Diagnosis present

## 2018-01-28 DIAGNOSIS — S00212A Abrasion of left eyelid and periocular area, initial encounter: Secondary | ICD-10-CM | POA: Diagnosis present

## 2018-01-28 DIAGNOSIS — Z8249 Family history of ischemic heart disease and other diseases of the circulatory system: Secondary | ICD-10-CM

## 2018-01-28 DIAGNOSIS — F410 Panic disorder [episodic paroxysmal anxiety] without agoraphobia: Secondary | ICD-10-CM | POA: Diagnosis present

## 2018-01-28 DIAGNOSIS — R609 Edema, unspecified: Secondary | ICD-10-CM

## 2018-01-28 DIAGNOSIS — I2699 Other pulmonary embolism without acute cor pulmonale: Principal | ICD-10-CM | POA: Diagnosis present

## 2018-01-28 DIAGNOSIS — S51011A Laceration without foreign body of right elbow, initial encounter: Secondary | ICD-10-CM | POA: Diagnosis present

## 2018-01-28 DIAGNOSIS — Y92414 Local residential or business street as the place of occurrence of the external cause: Secondary | ICD-10-CM | POA: Diagnosis not present

## 2018-01-28 DIAGNOSIS — Z79891 Long term (current) use of opiate analgesic: Secondary | ICD-10-CM | POA: Diagnosis not present

## 2018-01-28 DIAGNOSIS — S2241XA Multiple fractures of ribs, right side, initial encounter for closed fracture: Secondary | ICD-10-CM | POA: Diagnosis present

## 2018-01-28 LAB — CBC
HCT: 42 % (ref 35.0–47.0)
Hemoglobin: 14.6 g/dL (ref 12.0–16.0)
MCH: 32.2 pg (ref 26.0–34.0)
MCHC: 34.8 g/dL (ref 32.0–36.0)
MCV: 92.5 fL (ref 80.0–100.0)
Platelets: 302 10*3/uL (ref 150–440)
RBC: 4.54 MIL/uL (ref 3.80–5.20)
RDW: 13.9 % (ref 11.5–14.5)
WBC: 13.1 10*3/uL — ABNORMAL HIGH (ref 3.6–11.0)

## 2018-01-28 LAB — COMPREHENSIVE METABOLIC PANEL
ALT: 22 U/L (ref 14–54)
AST: 35 U/L (ref 15–41)
Albumin: 4 g/dL (ref 3.5–5.0)
Alkaline Phosphatase: 72 U/L (ref 38–126)
Anion gap: 9 (ref 5–15)
BUN: 16 mg/dL (ref 6–20)
CO2: 24 mmol/L (ref 22–32)
Calcium: 9.3 mg/dL (ref 8.9–10.3)
Chloride: 104 mmol/L (ref 101–111)
Creatinine, Ser: 0.66 mg/dL (ref 0.44–1.00)
GFR calc Af Amer: >60 mL/min (ref >60)
GFR calc non Af Amer: >60 mL/min (ref >60)
Glucose, Bld: 105 mg/dL — ABNORMAL HIGH (ref 65–99)
Potassium: 3.6 mmol/L (ref 3.5–5.1)
Sodium: 137 mmol/L (ref 135–145)
Total Bilirubin: 0.8 mg/dL (ref 0.3–1.2)
Total Protein: 7.2 g/dL (ref 6.5–8.1)

## 2018-01-28 LAB — PROTIME-INR
INR: 1.11
Prothrombin Time: 14.2 seconds (ref 11.4–15.2)

## 2018-01-28 LAB — APTT: aPTT: 30 seconds (ref 24–36)

## 2018-01-28 MED ORDER — NICOTINE 14 MG/24HR TD PT24
14.0000 mg | MEDICATED_PATCH | Freq: Every day | TRANSDERMAL | Status: DC
  Administered 2018-01-28 – 2018-01-29 (×2): 14 mg via TRANSDERMAL
  Filled 2018-01-28 (×2): qty 1

## 2018-01-28 MED ORDER — HYDROMORPHONE HCL 1 MG/ML IJ SOLN
0.5000 mg | INTRAMUSCULAR | Status: DC | PRN
  Administered 2018-01-28 – 2018-01-29 (×7): 0.5 mg via INTRAVENOUS
  Filled 2018-01-28 (×7): qty 1

## 2018-01-28 MED ORDER — IOPAMIDOL (ISOVUE-300) INJECTION 61%
100.0000 mL | Freq: Once | INTRAVENOUS | Status: AC | PRN
  Administered 2018-01-28: 100 mL via INTRAVENOUS

## 2018-01-28 MED ORDER — BISACODYL 5 MG PO TBEC: 5.0000 mg | DELAYED_RELEASE_TABLET | Freq: Every day | ORAL | Status: DC | PRN

## 2018-01-28 MED ORDER — GABAPENTIN 300 MG PO CAPS
300.0000 mg | ORAL_CAPSULE | Freq: Three times a day (TID) | ORAL | Status: DC
  Administered 2018-01-28: 300 mg via ORAL
  Filled 2018-01-28: qty 1

## 2018-01-28 MED ORDER — LORATADINE 10 MG PO TABS
10.0000 mg | ORAL_TABLET | Freq: Every day | ORAL | Status: DC
  Administered 2018-01-28 – 2018-01-29 (×2): 10 mg via ORAL
  Filled 2018-01-28 (×2): qty 1

## 2018-01-28 MED ORDER — ONDANSETRON HCL 4 MG PO TABS: 4.0000 mg | ORAL_TABLET | Freq: Four times a day (QID) | ORAL | Status: DC | PRN

## 2018-01-28 MED ORDER — LIDOCAINE HCL (PF) 1 % IJ SOLN
INTRAMUSCULAR | Status: AC
  Filled 2018-01-28: qty 5

## 2018-01-28 MED ORDER — TRAZODONE HCL 100 MG PO TABS
200.0000 mg | ORAL_TABLET | Freq: Every day | ORAL | Status: DC
  Administered 2018-01-28: 200 mg via ORAL
  Filled 2018-01-28: qty 2

## 2018-01-28 MED ORDER — TRAMADOL HCL 50 MG PO TABS: 50.0000 mg | ORAL_TABLET | Freq: Four times a day (QID) | ORAL | Status: DC | PRN

## 2018-01-28 MED ORDER — SODIUM CHLORIDE 0.9 % IV BOLUS
1000.0000 mL | Freq: Once | INTRAVENOUS | Status: AC
  Administered 2018-01-28: 1000 mL via INTRAVENOUS

## 2018-01-28 MED ORDER — SENNOSIDES-DOCUSATE SODIUM 8.6-50 MG PO TABS
1.0000 | ORAL_TABLET | Freq: Every evening | ORAL | Status: DC | PRN
  Administered 2018-01-29: 1 via ORAL
  Filled 2018-01-28: qty 1

## 2018-01-28 MED ORDER — ACETAMINOPHEN 650 MG RE SUPP: 650.0000 mg | Freq: Four times a day (QID) | RECTAL | Status: DC | PRN

## 2018-01-28 MED ORDER — MORPHINE SULFATE (PF) 4 MG/ML IV SOLN
INTRAVENOUS | Status: AC
  Filled 2018-01-28: qty 1

## 2018-01-28 MED ORDER — ONDANSETRON HCL 4 MG/2ML IJ SOLN: 4.0000 mg | Freq: Four times a day (QID) | INTRAMUSCULAR | Status: DC | PRN

## 2018-01-28 MED ORDER — HEPARIN (PORCINE) IN NACL 100-0.45 UNIT/ML-% IJ SOLN
1150.0000 [IU]/h | INTRAMUSCULAR | Status: AC
  Administered 2018-01-28: 1000 [IU]/h via INTRAVENOUS
  Filled 2018-01-28 (×2): qty 250

## 2018-01-28 MED ORDER — MORPHINE SULFATE (PF) 4 MG/ML IV SOLN
4.0000 mg | Freq: Once | INTRAVENOUS | Status: AC
  Administered 2018-01-28: 4 mg via INTRAVENOUS
  Filled 2018-01-28: qty 1

## 2018-01-28 MED ORDER — ACETAMINOPHEN 325 MG PO TABS: 650.0000 mg | ORAL_TABLET | Freq: Four times a day (QID) | ORAL | Status: DC | PRN

## 2018-01-28 MED ORDER — ALBUTEROL SULFATE (2.5 MG/3ML) 0.083% IN NEBU: 2.5000 mg | INHALATION_SOLUTION | RESPIRATORY_TRACT | Status: DC | PRN

## 2018-01-28 MED ORDER — PAROXETINE HCL 30 MG PO TABS
30.0000 mg | ORAL_TABLET | Freq: Every day | ORAL | Status: DC
  Administered 2018-01-29: 30 mg via ORAL
  Filled 2018-01-28: qty 1

## 2018-01-28 MED ORDER — ONDANSETRON HCL 4 MG/2ML IJ SOLN
4.0000 mg | Freq: Once | INTRAMUSCULAR | Status: AC
Start: 2018-01-28 — End: 2018-01-28
  Administered 2018-01-28: 4 mg via INTRAVENOUS
  Filled 2018-01-28: qty 2

## 2018-01-28 MED ORDER — MORPHINE SULFATE (PF) 4 MG/ML IV SOLN
4.0000 mg | Freq: Once | INTRAVENOUS | Status: AC
  Administered 2018-01-28: 4 mg via INTRAVENOUS

## 2018-01-28 MED ORDER — HEPARIN BOLUS VIA INFUSION
3600.0000 [IU] | Freq: Once | INTRAVENOUS | Status: AC
  Administered 2018-01-28: 3600 [IU] via INTRAVENOUS
  Filled 2018-01-28: qty 3600
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
## 2018-01-28 NOTE — ED Notes (Signed)
Patient transported to CT 

## 2018-01-28 NOTE — Progress Notes (Signed)
ANTICOAGULATION CONSULT NOTE - Initial Consult  Pharmacy Consult for heparin gtt Indication: pulmonary embolus  Allergies  Allergen Reactions  . Penicillins Hives, Swelling and Other (See Comments)    Has patient had a PCN reaction causing immediate rash, facial/tongue/throat swelling, SOB or lightheadedness with hypotension: No Has patient had a PCN reaction causing severe rash involving mucus membranes or skin necrosis: No Has patient had a PCN reaction that required hospitalization No Has patient had a PCN reaction occurring within the last 10 years: No If all of the above answers are "NO", then may proceed with Cephalosporin use.     Patient Measurements: Height: 5\' 6"  (167.6 cm) Weight: 140 lb (63.5 kg) IBW/kg (Calculated) : 59.3 Heparin Dosing Weight: 63.5kg  Vital Signs: Temp: 98.6 F (37 C) (06/20 1318) Temp Source: Oral (06/20 1318) BP: 122/81 (06/20 1500) Pulse Rate: 73 (06/20 1500)  Labs: Recent Labs    01/28/18 1425  HGB 14.6  HCT 42.0  PLT 302  CREATININE 0.66    Estimated Creatinine Clearance: 77.9 mL/min (by C-G formula based on SCr of 0.66 mg/dL).   Medical History: Past Medical History:  Diagnosis Date  . Allergy   . Anxiety    panic attacks  . Depressed affect     Medications:   (Not in a hospital admission) Scheduled:  . heparin  3,600 Units Intravenous Once  . lidocaine (PF)       Infusions:  . heparin     PRN: HYDROmorphone (DILAUDID) injection Anti-infectives (From admission, onward)   None      Assessment: 51 year old female with PE,  Requiring anticoagulation with heparin gtt per pharmacy consult. Patient not on [facility] prior to admission.   Goal of Therapy:  Heparin level 0.3-0.7 units/ml Monitor platelets by anticoagulation protocol: Yes   Plan:  Give 3600 units bolus x 1 Start heparin infusion at 1000 units/hr Check anti-Xa level in 6 hours and daily while on heparin Continue to monitor H&H and platelets  Donna Christen  Hendrik Donath 01/28/2018,4:33 PM

## 2018-01-28 NOTE — Progress Notes (Addendum)
Pt requested to have a nicotine patch. Page prime. Doctor Sudini called an order nicotine patch 14 mg daily. Will continue to monitor.  Pt states that her throat is itching and mostly take zyrtec daily at bedtime for it. Notify Prime. Awaiting order. Will continue to monitor.  Update 2100: Doctor Sudini ordered Claritin 10 mg tablet daily at 2100. Will continue to monitor.  Update 0100: Pt reported that she coughed a small amount of blood on her tissue.  Pt was assessed and the only complained was an epigastric pain 6 out 10. Tissue did have a small smear of blood to it. Dilaudid was already administered for her pain on her rib cage. Page prime. Will continue to monitor.  Update 0143: Doctor Duane Boston called. Just order lorazepam 1 mg twice a day for anxiety and just monitor pt. Will continue to monitor.  Update 0514: Pt potassium was at 3.0. Page prime. Awaiting callback. Will continue to monitor.  Update 0515. Doctor Jodell Cipro ordered potassium Chloride 40 mEQ twice a day. Will continue to monitor.
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
## 2018-01-28 NOTE — ED Notes (Signed)
Report called to receiving RN and pt updated with plan of care.

## 2018-01-29 ENCOUNTER — Inpatient Hospital Stay: Payer: Medicare PPO

## 2018-01-29 LAB — MAGNESIUM: Magnesium: 1.8 mg/dL (ref 1.7–2.4)

## 2018-01-29 LAB — BASIC METABOLIC PANEL
Anion gap: 8 (ref 5–15)
BUN: 12 mg/dL (ref 6–20)
CO2: 25 mmol/L (ref 22–32)
Calcium: 8.7 mg/dL — ABNORMAL LOW (ref 8.9–10.3)
Chloride: 105 mmol/L (ref 101–111)
Creatinine, Ser: 0.67 mg/dL (ref 0.44–1.00)
GFR calc Af Amer: >60 mL/min (ref >60)
GFR calc non Af Amer: >60 mL/min (ref >60)
Glucose, Bld: 112 mg/dL — ABNORMAL HIGH (ref 65–99)
Potassium: 3 mmol/L — ABNORMAL LOW (ref 3.5–5.1)
Sodium: 138 mmol/L (ref 135–145)

## 2018-01-29 LAB — HEPARIN LEVEL (UNFRACTIONATED)
Heparin Unfractionated: 0.22 IU/mL — ABNORMAL LOW (ref 0.30–0.70)
Heparin Unfractionated: 0.44 IU/mL (ref 0.30–0.70)

## 2018-01-29 LAB — CBC
HCT: 37.8 % (ref 35.0–47.0)
Hemoglobin: 13.2 g/dL (ref 12.0–16.0)
MCH: 32.4 pg (ref 26.0–34.0)
MCHC: 35 g/dL (ref 32.0–36.0)
MCV: 92.5 fL (ref 80.0–100.0)
Platelets: 286 10*3/uL (ref 150–440)
RBC: 4.08 MIL/uL (ref 3.80–5.20)
RDW: 14.1 % (ref 11.5–14.5)
WBC: 8.8 10*3/uL (ref 3.6–11.0)

## 2018-01-29 LAB — ANTITHROMBIN III: AntiThromb III Func: 87 % (ref 75–120)

## 2018-01-29 MED ORDER — HEPARIN BOLUS VIA INFUSION
1000.0000 [IU] | Freq: Once | INTRAVENOUS | Status: AC
  Administered 2018-01-29: 1000 [IU] via INTRAVENOUS
  Filled 2018-01-29: qty 1000

## 2018-01-29 MED ORDER — HYDROMORPHONE HCL 1 MG/ML IJ SOLN
1.0000 mg | INTRAMUSCULAR | Status: DC | PRN
  Administered 2018-01-29: 1 mg via INTRAVENOUS
  Filled 2018-01-29: qty 1

## 2018-01-29 MED ORDER — APIXABAN 5 MG PO TABS: 5.0000 mg | ORAL_TABLET | Freq: Two times a day (BID) | ORAL | Status: DC

## 2018-01-29 MED ORDER — LORAZEPAM 1 MG PO TABS
1.0000 mg | ORAL_TABLET | Freq: Two times a day (BID) | ORAL | Status: DC
  Administered 2018-01-29: 1 mg via ORAL
  Filled 2018-01-29: qty 1

## 2018-01-29 MED ORDER — OXYCODONE HCL 5 MG PO TABS: 5.0000 mg | ORAL_TABLET | ORAL | 0 refills | Status: AC | PRN

## 2018-01-29 MED ORDER — APIXABAN 5 MG PO TABS
10.0000 mg | ORAL_TABLET | Freq: Two times a day (BID) | ORAL | Status: DC
  Administered 2018-01-29: 10 mg via ORAL
  Filled 2018-01-29: qty 2

## 2018-01-29 MED ORDER — POTASSIUM CHLORIDE CRYS ER 20 MEQ PO TBCR
40.0000 meq | EXTENDED_RELEASE_TABLET | Freq: Two times a day (BID) | ORAL | Status: DC
  Administered 2018-01-29: 40 meq via ORAL
  Filled 2018-01-29: qty 2

## 2018-01-29 MED ORDER — APIXABAN 5 MG PO TABS: 10.0000 mg | ORAL_TABLET | Freq: Two times a day (BID) | ORAL | 0 refills | Status: DC

## 2018-01-29 MED ORDER — APIXABAN 5 MG PO TABS: 10.0000 mg | ORAL_TABLET | Freq: Two times a day (BID) | ORAL | Status: DC

## 2018-01-29 MED ORDER — OXYCODONE HCL 5 MG PO TABS
5.0000 mg | ORAL_TABLET | Freq: Four times a day (QID) | ORAL | Status: DC | PRN
  Administered 2018-01-29: 5 mg via ORAL
  Filled 2018-01-29: qty 1

## 2018-01-29 MED ORDER — APIXABAN 5 MG PO TABS: 5.0000 mg | ORAL_TABLET | Freq: Two times a day (BID) | ORAL | 2 refills | Status: DC

## 2018-01-29 NOTE — Plan of Care (Signed)
  Problem: Education: Goal: Knowledge of General Education information will improve Outcome: Progressing   Problem: Health Behavior/Discharge Planning: Goal: Ability to manage health-related needs will improve Outcome: Progressing   Problem: Safety: Goal: Ability to remain free from injury will improve Outcome: Progressing   

## 2018-01-29 NOTE — Care Management (Signed)
RNCM consult for cost of Eliquis.  Prescription has been escribed to pharmacy.  RNCM to confirm copay, and will provide patient with 30 day coupon prior to discharge.
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
## 2018-01-29 NOTE — Progress Notes (Signed)
ANTICOAGULATION CONSULT NOTE - Initial Consult  Pharmacy Consult for Apixaban Indication: pulmonary embolus  Allergies  Allergen Reactions  . Penicillins Hives, Swelling and Other (See Comments)    Has patient had a PCN reaction causing immediate rash, facial/tongue/throat swelling, SOB or lightheadedness with hypotension: No Has patient had a PCN reaction causing severe rash involving mucus membranes or skin necrosis: No Has patient had a PCN reaction that required hospitalization No Has patient had a PCN reaction occurring within the last 10 years: No If all of the above answers are "NO", then may proceed with Cephalosporin use.     Patient Measurements: Height: 5\' 6"  (167.6 cm) Weight: 140 lb (63.5 kg) IBW/kg (Calculated) : 59.3 Heparin Dosing Weight: 63.5kg  Vital Signs: Temp: 98.4 F (36.9 C) (06/21 0801) Temp Source: Oral (06/21 0801) BP: 97/74 (06/21 0801) Pulse Rate: 68 (06/21 0801)  Labs: Recent Labs    01/28/18 1425 01/28/18 1633 01/29/18 0040 01/29/18 0734  HGB 14.6  --  13.2  --   HCT 42.0  --  37.8  --   PLT 302  --  286  --   APTT  --  30  --   --   LABPROT  --  14.2  --   --   INR  --  1.11  --   --   HEPARINUNFRC  --   --  0.22* 0.44  CREATININE 0.66  --  0.67  --     Estimated Creatinine Clearance: 77.9 mL/min (by C-G formula based on SCr of 0.67 mg/dL).   Medical History: Past Medical History:  Diagnosis Date  . Allergy   . Anxiety    panic attacks  . Depressed affect     Medications:  Medications Prior to Admission  Medication Sig Dispense Refill Last Dose  . gabapentin (NEURONTIN) 300 MG capsule Take 300 mg by mouth 3 (three) times daily. Reported on 12/03/2015   01/28/2018 at Unknown time  . oxyCODONE (OXY IR/ROXICODONE) 5 MG immediate release tablet Take 1 tablet by mouth every 4 (four) hours as needed.  0 01/28/2018 at Unknown time  . PARoxetine (PAXIL) 30 MG tablet Take 30 mg by mouth daily.   01/28/2018 at Unknown time  . traZODone  (DESYREL) 100 MG tablet Take 200 mg by mouth at bedtime.    01/27/2018 at Unknown time  . acetaminophen (TYLENOL) 500 MG tablet Take 500 mg by mouth every 4 (four) hours as needed for mild pain, fever or headache.    prn at prn  . ibuprofen (ADVIL,MOTRIN) 200 MG tablet Take 800 mg by mouth every 6 (six) hours as needed for headache or mild pain.   prn at prn  . methocarbamol (ROBAXIN-750) 750 MG tablet Take 2 tablets (1,500 mg total) by mouth 4 (four) times daily. (Patient not taking: Reported on 01/28/2018) 40 tablet 0 Not Taking at Unknown time  . oxyCODONE-acetaminophen (PERCOCET) 7.5-325 MG tablet Take 1 tablet by mouth every 6 (six) hours as needed for severe pain. (Patient not taking: Reported on 01/28/2018) 12 tablet 0 Not Taking at Unknown time  . predniSONE (DELTASONE) 20 MG tablet Two tabs by mouth for three days then One tab by mouth for three days then One half tab by mouth for three days then STOP (Patient not taking: Reported on 01/28/2018) 15 tablet 0 Not Taking at Unknown time  . predniSONE (STERAPRED UNI-PAK 21 TAB) 10 MG (21) TBPK tablet Take by mouth daily. Dispense steroid taper pack as directed (Patient not  taking: Reported on 01/28/2018) 21 tablet 0 Not Taking at Unknown time  . VENTOLIN HFA 108 (90 Base) MCG/ACT inhaler Inhale 2 puffs into the lungs every 4 (four) hours as needed.  1 prn at prn   Scheduled:  . apixaban  10 mg Oral BID   Followed by  . [START ON 02/05/2018] apixaban  5 mg Oral BID  . gabapentin  300 mg Oral TID  . loratadine  10 mg Oral Daily  . LORazepam  1 mg Oral BID  . nicotine  14 mg Transdermal Daily  . PARoxetine  30 mg Oral Daily  . potassium chloride  40 mEq Oral BID  . traZODone  200 mg Oral QHS   Infusions:  . heparin 1,150 Units/hr (01/29/18 0159)   PRN: acetaminophen **OR** acetaminophen, albuterol, bisacodyl, HYDROmorphone (DILAUDID) injection, ondansetron **OR** ondansetron (ZOFRAN) IV, senna-docusate, traMADol Anti-infectives (From admission,  onward)   None      Assessment: 52 year old female with PE,  Requiring anticoagulation with heparin gtt per pharmacy consult. Patient not on [facility] prior to admission. Will convert patient to apixaban.   Plan:  Will begin apixaban 10 mg bid x 7 days followed by 5 mg bid. RN instructed to stop heparin infusion at time of first apixaban dose.   Ulice Dash, PharmD Clinical Pharmacist  01/29/2018

## 2018-01-29 NOTE — Progress Notes (Signed)
Autumn Messing to be D/C'd Home per MD order.  Discussed prescriptions and follow up appointments with the patient. Prescriptions were e-prescribed, medication list explained in detail. Pt verbalized understanding. Husband at bedside who will providing transportation.  Allergies as of 01/29/2018      Reactions   Penicillins Hives, Swelling, Other (See Comments)   Has patient had a PCN reaction causing immediate rash, facial/tongue/throat swelling, SOB or lightheadedness with hypotension: No Has patient had a PCN reaction causing severe rash involving mucus membranes or skin necrosis: No Has patient had a PCN reaction that required hospitalization No Has patient had a PCN reaction occurring within the last 10 years: No If all of the above answers are "NO", then may proceed with Cephalosporin use.      Medication List    STOP taking these medications   methocarbamol 750 MG tablet Commonly known as:  ROBAXIN-750   oxyCODONE-acetaminophen 7.5-325 MG tablet Commonly known as:  PERCOCET   predniSONE 10 MG (21) Tbpk tablet Commonly known as:  STERAPRED UNI-PAK 21 TAB   predniSONE 20 MG tablet Commonly known as:  DELTASONE     TAKE these medications   acetaminophen 500 MG tablet Commonly known as:  TYLENOL Take 500 mg by mouth every 4 (four) hours as needed for mild pain, fever or headache.   apixaban 5 MG Tabs tablet Commonly known as:  ELIQUIS Take 2 tablets (10 mg total) by mouth 2 (two) times daily for 7 days.   apixaban 5 MG Tabs tablet Commonly known as:  ELIQUIS Take 1 tablet (5 mg total) by mouth 2 (two) times daily. Start taking on:  02/12/2018   gabapentin 300 MG capsule Commonly known as:  NEURONTIN Take 300 mg by mouth 3 (three) times daily. Reported on 12/03/2015   ibuprofen 200 MG tablet Commonly known as:  ADVIL,MOTRIN Take 800 mg by mouth every 6 (six) hours as needed for headache or mild pain.   oxyCODONE 5 MG immediate release tablet Commonly known as:  Oxy  IR/ROXICODONE Take 1 tablet (5 mg total) by mouth every 4 (four) hours as needed for up to 10 days.   PARoxetine 30 MG tablet Commonly known as:  PAXIL Take 30 mg by mouth daily.   traZODone 100 MG tablet Commonly known as:  DESYREL Take 200 mg by mouth at bedtime.   VENTOLIN HFA 108 (90 Base) MCG/ACT inhaler Generic drug:  albuterol Inhale 2 puffs into the lungs every 4 (four) hours as needed.       Vitals:   01/29/18 1104 01/29/18 1354  BP: 104/69 (!) 124/93  Pulse: 77 79  Resp: 18 18  Temp: 98.8 F (37.1 C) 98.4 F (36.9 C)  SpO2: 98% 94%    Tele box removed and returned.Skin clean, dry and intact without evidence of skin break down, no evidence of skin tears noted. IV catheter discontinued intact. Site without signs and symptoms of complications. Dressing and pressure applied.   An After Visit Summary was printed and given to the patient. Patient escorted via [place], and D/C home via private auto.  Rolley Sims

## 2018-01-29 NOTE — Progress Notes (Signed)
ANTICOAGULATION CONSULT NOTE - Initial Consult  Pharmacy Consult for heparin gtt Indication: pulmonary embolus  Allergies  Allergen Reactions  . Penicillins Hives, Swelling and Other (See Comments)    Has patient had a PCN reaction causing immediate rash, facial/tongue/throat swelling, SOB or lightheadedness with hypotension: No Has patient had a PCN reaction causing severe rash involving mucus membranes or skin necrosis: No Has patient had a PCN reaction that required hospitalization No Has patient had a PCN reaction occurring within the last 10 years: No If all of the above answers are "NO", then may proceed with Cephalosporin use.     Patient Measurements: Height: 5\' 6"  (167.6 cm) Weight: 140 lb (63.5 kg) IBW/kg (Calculated) : 59.3 Heparin Dosing Weight: 63.5kg  Vital Signs: Temp: 98.9 F (37.2 C) (06/20 1935) Temp Source: Oral (06/20 1935) BP: 116/86 (06/20 1935) Pulse Rate: 74 (06/20 1935)  Labs: Recent Labs    01/28/18 1425 01/28/18 1633 01/29/18 0040  HGB 14.6  --  13.2  HCT 42.0  --  37.8  PLT 302  --  286  APTT  --  30  --   LABPROT  --  14.2  --   INR  --  1.11  --   HEPARINUNFRC  --   --  0.22*  CREATININE 0.66  --  0.67    Estimated Creatinine Clearance: 77.9 mL/min (by C-G formula based on SCr of 0.67 mg/dL).   Medical History: Past Medical History:  Diagnosis Date  . Allergy   . Anxiety    panic attacks  . Depressed affect     Medications:  Medications Prior to Admission  Medication Sig Dispense Refill Last Dose  . gabapentin (NEURONTIN) 300 MG capsule Take 300 mg by mouth 3 (three) times daily. Reported on 12/03/2015   01/28/2018 at Unknown time  . oxyCODONE (OXY IR/ROXICODONE) 5 MG immediate release tablet Take 1 tablet by mouth every 4 (four) hours as needed.  0 01/28/2018 at Unknown time  . PARoxetine (PAXIL) 30 MG tablet Take 30 mg by mouth daily.   01/28/2018 at Unknown time  . traZODone (DESYREL) 100 MG tablet Take 200 mg by mouth at  bedtime.    01/27/2018 at Unknown time  . acetaminophen (TYLENOL) 500 MG tablet Take 500 mg by mouth every 4 (four) hours as needed for mild pain, fever or headache.    prn at prn  . ibuprofen (ADVIL,MOTRIN) 200 MG tablet Take 800 mg by mouth every 6 (six) hours as needed for headache or mild pain.   prn at prn  . methocarbamol (ROBAXIN-750) 750 MG tablet Take 2 tablets (1,500 mg total) by mouth 4 (four) times daily. (Patient not taking: Reported on 01/28/2018) 40 tablet 0 Not Taking at Unknown time  . oxyCODONE-acetaminophen (PERCOCET) 7.5-325 MG tablet Take 1 tablet by mouth every 6 (six) hours as needed for severe pain. (Patient not taking: Reported on 01/28/2018) 12 tablet 0 Not Taking at Unknown time  . predniSONE (DELTASONE) 20 MG tablet Two tabs by mouth for three days then One tab by mouth for three days then One half tab by mouth for three days then STOP (Patient not taking: Reported on 01/28/2018) 15 tablet 0 Not Taking at Unknown time  . predniSONE (STERAPRED UNI-PAK 21 TAB) 10 MG (21) TBPK tablet Take by mouth daily. Dispense steroid taper pack as directed (Patient not taking: Reported on 01/28/2018) 21 tablet 0 Not Taking at Unknown time  . VENTOLIN HFA 108 (90 Base) MCG/ACT inhaler Inhale 2  puffs into the lungs every 4 (four) hours as needed.  1 prn at prn   Scheduled:  . gabapentin  300 mg Oral TID  . heparin  1,000 Units Intravenous Once  . lidocaine (PF)      . loratadine  10 mg Oral Daily  . LORazepam  1 mg Oral BID  . nicotine  14 mg Transdermal Daily  . PARoxetine  30 mg Oral Daily  . traZODone  200 mg Oral QHS   Infusions:  . heparin 1,000 Units/hr (01/28/18 1801)   PRN: acetaminophen **OR** acetaminophen, albuterol, bisacodyl, HYDROmorphone (DILAUDID) injection, ondansetron **OR** ondansetron (ZOFRAN) IV, senna-docusate, traMADol Anti-infectives (From admission, onward)   None      Assessment: 52 year old female with PE,  Requiring anticoagulation with heparin gtt per  pharmacy consult. Patient not on [facility] prior to admission.   Goal of Therapy:  Heparin level 0.3-0.7 units/ml Monitor platelets by anticoagulation protocol: Yes   Plan:  06/21 @ 0040 HL 0.22 subtherapeutic. Will rebolus w/ heparin 1000 units IV x 1 and increase rate to 1150 units/hr and will recheck HL @ 0700. Hgb trended down by 1 unit will continue to monitor.  Tobie Lords, PharmD, BCPS Clinical Pharmacist 01/29/2018

## 2018-01-29 NOTE — Progress Notes (Signed)
Initially patient was planned to be discharged in the morning she and her husband stated that there was no one there to take care of her therefore I stated to the patient that we can watch her today and discharge her tomorrow.  Then I received a call from nurse stating that she wanted to be discharged today so I discharged patient and give her dose of oxycodone 5 mg.  Patient states that I am not adequately treating her pain and she will report me.  I have discussed with the pharmacist patient gets 84 tablets of oxycodone every 14 days.  I recommended patient to use ibuprofen and use oxycodone acutely 5 to 10 mg as needed for severe pain.  I do not feel comfortable giving her more tablets than what I have given her.  Which is 30 more tablets of oxycodone 5 mg.

## 2018-01-29 NOTE — Progress Notes (Signed)
ANTICOAGULATION CONSULT NOTE - Initial Consult  Pharmacy Consult for heparin gtt Indication: pulmonary embolus  Allergies  Allergen Reactions  . Penicillins Hives, Swelling and Other (See Comments)    Has patient had a PCN reaction causing immediate rash, facial/tongue/throat swelling, SOB or lightheadedness with hypotension: No Has patient had a PCN reaction causing severe rash involving mucus membranes or skin necrosis: No Has patient had a PCN reaction that required hospitalization No Has patient had a PCN reaction occurring within the last 10 years: No If all of the above answers are "NO", then may proceed with Cephalosporin use.     Patient Measurements: Height: 5\' 6"  (167.6 cm) Weight: 140 lb (63.5 kg) IBW/kg (Calculated) : 59.3 Heparin Dosing Weight: 63.5kg  Vital Signs: Temp: 98.4 F (36.9 C) (06/21 0801) Temp Source: Oral (06/21 0801) BP: 97/74 (06/21 0801) Pulse Rate: 68 (06/21 0801)  Labs: Recent Labs    01/28/18 1425 01/28/18 1633 01/29/18 0040 01/29/18 0734  HGB 14.6  --  13.2  --   HCT 42.0  --  37.8  --   PLT 302  --  286  --   APTT  --  30  --   --   LABPROT  --  14.2  --   --   INR  --  1.11  --   --   HEPARINUNFRC  --   --  0.22* 0.44  CREATININE 0.66  --  0.67  --     Estimated Creatinine Clearance: 77.9 mL/min (by C-G formula based on SCr of 0.67 mg/dL).   Medical History: Past Medical History:  Diagnosis Date  . Allergy   . Anxiety    panic attacks  . Depressed affect     Medications:  Medications Prior to Admission  Medication Sig Dispense Refill Last Dose  . gabapentin (NEURONTIN) 300 MG capsule Take 300 mg by mouth 3 (three) times daily. Reported on 12/03/2015   01/28/2018 at Unknown time  . oxyCODONE (OXY IR/ROXICODONE) 5 MG immediate release tablet Take 1 tablet by mouth every 4 (four) hours as needed.  0 01/28/2018 at Unknown time  . PARoxetine (PAXIL) 30 MG tablet Take 30 mg by mouth daily.   01/28/2018 at Unknown time  .  traZODone (DESYREL) 100 MG tablet Take 200 mg by mouth at bedtime.    01/27/2018 at Unknown time  . acetaminophen (TYLENOL) 500 MG tablet Take 500 mg by mouth every 4 (four) hours as needed for mild pain, fever or headache.    prn at prn  . ibuprofen (ADVIL,MOTRIN) 200 MG tablet Take 800 mg by mouth every 6 (six) hours as needed for headache or mild pain.   prn at prn  . methocarbamol (ROBAXIN-750) 750 MG tablet Take 2 tablets (1,500 mg total) by mouth 4 (four) times daily. (Patient not taking: Reported on 01/28/2018) 40 tablet 0 Not Taking at Unknown time  . oxyCODONE-acetaminophen (PERCOCET) 7.5-325 MG tablet Take 1 tablet by mouth every 6 (six) hours as needed for severe pain. (Patient not taking: Reported on 01/28/2018) 12 tablet 0 Not Taking at Unknown time  . predniSONE (DELTASONE) 20 MG tablet Two tabs by mouth for three days then One tab by mouth for three days then One half tab by mouth for three days then STOP (Patient not taking: Reported on 01/28/2018) 15 tablet 0 Not Taking at Unknown time  . predniSONE (STERAPRED UNI-PAK 21 TAB) 10 MG (21) TBPK tablet Take by mouth daily. Dispense steroid taper pack as directed (Patient  not taking: Reported on 01/28/2018) 21 tablet 0 Not Taking at Unknown time  . VENTOLIN HFA 108 (90 Base) MCG/ACT inhaler Inhale 2 puffs into the lungs every 4 (four) hours as needed.  1 prn at prn   Scheduled:  . gabapentin  300 mg Oral TID  . loratadine  10 mg Oral Daily  . LORazepam  1 mg Oral BID  . nicotine  14 mg Transdermal Daily  . PARoxetine  30 mg Oral Daily  . potassium chloride  40 mEq Oral BID  . traZODone  200 mg Oral QHS   Infusions:  . heparin 1,150 Units/hr (01/29/18 0159)   PRN: acetaminophen **OR** acetaminophen, albuterol, bisacodyl, HYDROmorphone (DILAUDID) injection, ondansetron **OR** ondansetron (ZOFRAN) IV, senna-docusate, traMADol Anti-infectives (From admission, onward)   None      Assessment: 52 year old female with PE,  Requiring  anticoagulation with heparin gtt per pharmacy consult. Patient not on [facility] prior to admission.   Goal of Therapy:  Heparin level 0.3-0.7 units/ml Monitor platelets by anticoagulation protocol: Yes   Plan:  Continue heparin infusion at current rate and recheck a HL in 6 hours.   Ulice Dash, PharmD Clinical Pharmacist  01/29/2018

## 2018-01-29 NOTE — Care Management (Signed)
Patient to discharge today.  Patient given 30 day trial coupon for Eliquis.  RNCM spoke with pharmacy at [facility]. Per [employer] patient has a deductible of $265 that she has not met.  1st week of medication will cost patient $172 out of pocket.  Then she will be be able to use the 30 coupon on the remaining 23 days.  After patient meets her deductible her monthly cost will be $14.65.  Patient was provided with this information.  Patient states that she will be able to pay the $172 to get the medication today.  RNCM signing off.

## 2018-01-30 ENCOUNTER — Emergency Department: Payer: Medicare PPO

## 2018-01-30 ENCOUNTER — Other Ambulatory Visit: Payer: Self-pay

## 2018-01-30 ENCOUNTER — Encounter: Payer: Self-pay | Admitting: Emergency Medicine

## 2018-01-30 ENCOUNTER — Emergency Department
Admission: EM | Admit: 2018-01-30 | Discharge: 2018-01-30 | Disposition: A | Discharge status: Home / Self Care | Payer: Medicare PPO | Attending: Emergency Medicine | Admitting: Emergency Medicine

## 2018-01-30 DIAGNOSIS — R042 Hemoptysis: Secondary | ICD-10-CM | POA: Insufficient documentation

## 2018-01-30 DIAGNOSIS — Z86718 Personal history of other venous thrombosis and embolism: Secondary | ICD-10-CM | POA: Insufficient documentation

## 2018-01-30 DIAGNOSIS — Z7901 Long term (current) use of anticoagulants: Secondary | ICD-10-CM | POA: Diagnosis not present

## 2018-01-30 DIAGNOSIS — F17210 Nicotine dependence, cigarettes, uncomplicated: Secondary | ICD-10-CM | POA: Diagnosis not present

## 2018-01-30 DIAGNOSIS — Z79899 Other long term (current) drug therapy: Secondary | ICD-10-CM | POA: Insufficient documentation

## 2018-01-30 HISTORY — DX: Other pulmonary embolism without acute cor pulmonale: I26.99

## 2018-01-30 LAB — CBC WITH DIFFERENTIAL/PLATELET
Basophils Absolute: 0 10*3/uL (ref 0–0.1)
Basophils Relative: 1 %
Eosinophils Absolute: 0.1 10*3/uL (ref 0–0.7)
Eosinophils Relative: 1 %
HCT: 41.9 % (ref 35.0–47.0)
Hemoglobin: 14.5 g/dL (ref 12.0–16.0)
Lymphocytes Relative: 15 %
Lymphs Abs: 1.3 10*3/uL (ref 1.0–3.6)
MCH: 32.3 pg (ref 26.0–34.0)
MCHC: 34.6 g/dL (ref 32.0–36.0)
MCV: 93.4 fL (ref 80.0–100.0)
Monocytes Absolute: 0.6 10*3/uL (ref 0.2–0.9)
Monocytes Relative: 7 %
Neutro Abs: 6.3 10*3/uL (ref 1.4–6.5)
Neutrophils Relative %: 76 %
Platelets: 285 10*3/uL (ref 150–440)
RBC: 4.49 MIL/uL (ref 3.80–5.20)
RDW: 14.2 % (ref 11.5–14.5)
WBC: 8.3 10*3/uL (ref 3.6–11.0)

## 2018-01-30 LAB — BASIC METABOLIC PANEL
Anion gap: 7 (ref 5–15)
BUN: 12 mg/dL (ref 6–20)
CO2: 22 mmol/L (ref 22–32)
Calcium: 8.9 mg/dL (ref 8.9–10.3)
Chloride: 107 mmol/L (ref 101–111)
Creatinine, Ser: 0.72 mg/dL (ref 0.44–1.00)
GFR calc Af Amer: >60 mL/min (ref >60)
GFR calc non Af Amer: >60 mL/min (ref >60)
Glucose, Bld: 116 mg/dL — ABNORMAL HIGH (ref 65–99)
Potassium: 3.7 mmol/L (ref 3.5–5.1)
Sodium: 136 mmol/L (ref 135–145)

## 2018-01-30 LAB — APTT: aPTT: 37 seconds — ABNORMAL HIGH (ref 24–36)

## 2018-01-30 LAB — HIV ANTIBODY (ROUTINE TESTING W REFLEX): HIV Screen 4th Generation wRfx: NONREACTIVE

## 2018-01-30 LAB — PROTIME-INR
INR: 1.42
Prothrombin Time: 17.2 seconds — ABNORMAL HIGH (ref 11.4–15.2)

## 2018-01-30 MED ORDER — OXYCODONE-ACETAMINOPHEN 5-325 MG PO TABS
ORAL_TABLET | ORAL | Status: AC
  Filled 2018-01-30: qty 1

## 2018-01-30 MED ORDER — OXYCODONE-ACETAMINOPHEN 5-325 MG PO TABS
1.0000 | ORAL_TABLET | Freq: Once | ORAL | Status: AC
  Administered 2018-01-30: 1 via ORAL
  Filled 2018-01-30: qty 1
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
## 2018-01-30 NOTE — Discharge Instructions (Addendum)
Return to the emergency room if you develop fever, worsening chest pain or shortness of breath, or if you are having several episodes of blood in your sputum.
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
## 2018-01-30 NOTE — ED Notes (Signed)
Pt was educated on [place]. Pt is not able to get above 500 at this time, waiting on pain meds to work. Pt was instructed to use when a commercial came on the TV. Pt stated and understanding and showed by teach back method.

## 2018-01-30 NOTE — ED Notes (Signed)
EDP is at bedside.

## 2018-01-31 LAB — PROTEIN S, TOTAL: Protein S Ag, Total: 67 % (ref 60–150)

## 2018-01-31 LAB — CARDIOLIPIN ANTIBODIES, IGG, IGM, IGA
Anticardiolipin IgA: <9 APL U/mL (ref 0–11)
Anticardiolipin IgG: <9 GPL U/mL (ref 0–14)
Anticardiolipin IgM: 9 MPL U/mL (ref 0–12)

## 2018-01-31 LAB — PROTEIN S ACTIVITY: Protein S Activity: 75 % (ref 63–140)

## 2018-01-31 LAB — PROTEIN C ACTIVITY: Protein C Activity: 73 % (ref 73–180)

## 2018-02-01 LAB — BETA-2-GLYCOPROTEIN I ABS, IGG/M/A
Beta-2 Glyco I IgG: <9 GPI IgG units (ref 0–20)
Beta-2-Glycoprotein I IgA: <9 GPI IgA units (ref 0–25)
Beta-2-Glycoprotein I IgM: <9 GPI IgM units (ref 0–32)

## 2018-02-01 LAB — LUPUS ANTICOAGULANT PANEL
DRVVT: 36.7 s (ref 0.0–47.0)
PTT Lupus Anticoagulant: 44.6 s (ref 0.0–51.9)

## 2018-02-01 LAB — HOMOCYSTEINE: Homocysteine: 25.2 umol/L — ABNORMAL HIGH (ref 0.0–15.0)

## 2018-02-02 LAB — PROTEIN C, TOTAL: Protein C, Total: 59 % — ABNORMAL LOW (ref 60–150)

## 2018-02-03 LAB — PROTHROMBIN GENE MUTATION

## 2018-02-04 LAB — FACTOR 5 LEIDEN

## 2018-02-16 ENCOUNTER — Ambulatory Visit: Payer: Medicare PPO | Admitting: Student in an Organized Health Care Education/Training Program

## 2018-04-06 ENCOUNTER — Other Ambulatory Visit: Payer: Self-pay

## 2018-04-06 ENCOUNTER — Ambulatory Visit
Payer: Medicare PPO | Attending: Student in an Organized Health Care Education/Training Program | Admitting: Student in an Organized Health Care Education/Training Program

## 2018-04-06 ENCOUNTER — Encounter: Payer: Self-pay | Admitting: Student in an Organized Health Care Education/Training Program

## 2018-04-06 VITALS — BP 121/91 | HR 111 | Temp 98.8°F | Resp 18 | Ht 66.0 in | Wt 140.0 lb

## 2018-04-06 DIAGNOSIS — F329 Major depressive disorder, single episode, unspecified: Secondary | ICD-10-CM | POA: Insufficient documentation

## 2018-04-06 DIAGNOSIS — M5116 Intervertebral disc disorders with radiculopathy, lumbar region: Secondary | ICD-10-CM | POA: Insufficient documentation

## 2018-04-06 DIAGNOSIS — I2699 Other pulmonary embolism without acute cor pulmonale: Secondary | ICD-10-CM | POA: Diagnosis not present

## 2018-04-06 DIAGNOSIS — M5136 Other intervertebral disc degeneration, lumbar region: Secondary | ICD-10-CM

## 2018-04-06 DIAGNOSIS — Z9889 Other specified postprocedural states: Secondary | ICD-10-CM

## 2018-04-06 DIAGNOSIS — Z7901 Long term (current) use of anticoagulants: Secondary | ICD-10-CM | POA: Diagnosis not present

## 2018-04-06 DIAGNOSIS — M47816 Spondylosis without myelopathy or radiculopathy, lumbar region: Secondary | ICD-10-CM

## 2018-04-06 DIAGNOSIS — G8929 Other chronic pain: Secondary | ICD-10-CM | POA: Diagnosis not present

## 2018-04-06 DIAGNOSIS — M5416 Radiculopathy, lumbar region: Secondary | ICD-10-CM

## 2018-04-06 DIAGNOSIS — M5030 Other cervical disc degeneration, unspecified cervical region: Secondary | ICD-10-CM | POA: Diagnosis not present

## 2018-04-06 DIAGNOSIS — M533 Sacrococcygeal disorders, not elsewhere classified: Secondary | ICD-10-CM

## 2018-04-06 DIAGNOSIS — M545 Low back pain: Secondary | ICD-10-CM | POA: Diagnosis present

## 2018-04-06 DIAGNOSIS — Z981 Arthrodesis status: Secondary | ICD-10-CM | POA: Insufficient documentation

## 2018-04-06 DIAGNOSIS — M25552 Pain in left hip: Secondary | ICD-10-CM | POA: Diagnosis present

## 2018-04-06 MED ORDER — PREGABALIN 50 MG PO CAPS: 50.0000 mg | ORAL_CAPSULE | Freq: Three times a day (TID) | ORAL | 1 refills | Status: DC

## 2018-04-06 MED ORDER — TIZANIDINE HCL 4 MG PO TABS: 4.0000 mg | ORAL_TABLET | Freq: Two times a day (BID) | ORAL | 1 refills | Status: DC | PRN
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
## 2018-04-06 NOTE — Progress Notes (Signed)
Safety precautions to be maintained throughout the outpatient stay will include: orient to surroundings, keep bed in low position, maintain call bell within reach at all times, provide assistance with transfer out of bed and ambulation.  

## 2018-04-06 NOTE — Patient Instructions (Signed)
Tizanidine and lyrica sent to pharmacy.

## 2018-04-10 LAB — COMPLIANCE DRUG ANALYSIS, UR

## 2018-04-26 ENCOUNTER — Telehealth: Payer: Self-pay | Admitting: *Deleted

## 2018-04-26 ENCOUNTER — Encounter: Payer: Self-pay | Admitting: *Deleted

## 2018-04-26 ENCOUNTER — Encounter: Payer: Self-pay | Admitting: Student in an Organized Health Care Education/Training Program

## 2018-04-26 NOTE — Progress Notes (Signed)
Patients UDS + for amphetamine, Xanax, and Hydrocodone which she is not prescribed. Letter sent to patient notifying her of UDS results and that she will not be a candidate for opioid therapy here. However, we can support her through non opioid analgesics.

## 2018-05-11 ENCOUNTER — Encounter: Payer: Medicare PPO | Admitting: Nurse Practitioner

## 2018-12-02 DIAGNOSIS — S0993XA Unspecified injury of face, initial encounter: Secondary | ICD-10-CM | POA: Diagnosis not present

## 2018-12-02 DIAGNOSIS — R0789 Other chest pain: Secondary | ICD-10-CM | POA: Diagnosis not present

## 2018-12-02 DIAGNOSIS — R0781 Pleurodynia: Secondary | ICD-10-CM | POA: Diagnosis not present

## 2018-12-02 DIAGNOSIS — Z23 Encounter for immunization: Secondary | ICD-10-CM | POA: Diagnosis not present

## 2018-12-02 DIAGNOSIS — S0181XA Laceration without foreign body of other part of head, initial encounter: Secondary | ICD-10-CM | POA: Diagnosis not present

## 2018-12-02 DIAGNOSIS — S299XXA Unspecified injury of thorax, initial encounter: Secondary | ICD-10-CM | POA: Diagnosis not present

## 2018-12-02 DIAGNOSIS — S6991XA Unspecified injury of right wrist, hand and finger(s), initial encounter: Secondary | ICD-10-CM | POA: Diagnosis not present

## 2019-01-29 DIAGNOSIS — J029 Acute pharyngitis, unspecified: Secondary | ICD-10-CM | POA: Diagnosis not present

## 2019-02-03 ENCOUNTER — Emergency Department: Payer: Medicare HMO

## 2019-02-03 ENCOUNTER — Other Ambulatory Visit: Payer: Self-pay

## 2019-02-03 ENCOUNTER — Inpatient Hospital Stay
Admission: EM | Admit: 2019-02-03 | Discharge: 2019-02-05 | DRG: 153 | Disposition: A | Discharge status: Home / Self Care | Payer: Medicare HMO | Attending: Internal Medicine | Admitting: Internal Medicine

## 2019-02-03 ENCOUNTER — Encounter: Payer: Self-pay | Admitting: Emergency Medicine

## 2019-02-03 DIAGNOSIS — J309 Allergic rhinitis, unspecified: Secondary | ICD-10-CM | POA: Diagnosis not present

## 2019-02-03 DIAGNOSIS — G8929 Other chronic pain: Secondary | ICD-10-CM | POA: Diagnosis not present

## 2019-02-03 DIAGNOSIS — Z79899 Other long term (current) drug therapy: Secondary | ICD-10-CM

## 2019-02-03 DIAGNOSIS — F17210 Nicotine dependence, cigarettes, uncomplicated: Secondary | ICD-10-CM | POA: Diagnosis present

## 2019-02-03 DIAGNOSIS — Z1159 Encounter for screening for other viral diseases: Secondary | ICD-10-CM | POA: Diagnosis not present

## 2019-02-03 DIAGNOSIS — J449 Chronic obstructive pulmonary disease, unspecified: Secondary | ICD-10-CM | POA: Diagnosis not present

## 2019-02-03 DIAGNOSIS — J390 Retropharyngeal and parapharyngeal abscess: Secondary | ICD-10-CM | POA: Diagnosis not present

## 2019-02-03 DIAGNOSIS — Z86711 Personal history of pulmonary embolism: Secondary | ICD-10-CM | POA: Diagnosis not present

## 2019-02-03 DIAGNOSIS — G894 Chronic pain syndrome: Secondary | ICD-10-CM | POA: Diagnosis not present

## 2019-02-03 DIAGNOSIS — J029 Acute pharyngitis, unspecified: Secondary | ICD-10-CM | POA: Diagnosis not present

## 2019-02-03 DIAGNOSIS — J36 Peritonsillar abscess: Principal | ICD-10-CM | POA: Diagnosis present

## 2019-02-03 DIAGNOSIS — Z20828 Contact with and (suspected) exposure to other viral communicable diseases: Secondary | ICD-10-CM | POA: Diagnosis not present

## 2019-02-03 DIAGNOSIS — R1310 Dysphagia, unspecified: Secondary | ICD-10-CM | POA: Diagnosis not present

## 2019-02-03 DIAGNOSIS — Z7901 Long term (current) use of anticoagulants: Secondary | ICD-10-CM | POA: Diagnosis not present

## 2019-02-03 DIAGNOSIS — F410 Panic disorder [episodic paroxysmal anxiety] without agoraphobia: Secondary | ICD-10-CM | POA: Diagnosis present

## 2019-02-03 DIAGNOSIS — F329 Major depressive disorder, single episode, unspecified: Secondary | ICD-10-CM | POA: Diagnosis present

## 2019-02-03 DIAGNOSIS — J351 Hypertrophy of tonsils: Secondary | ICD-10-CM | POA: Diagnosis not present

## 2019-02-03 LAB — COMPREHENSIVE METABOLIC PANEL
ALT: 20 U/L (ref 0–44)
AST: 16 U/L (ref 15–41)
Albumin: 3.8 g/dL (ref 3.5–5.0)
Alkaline Phosphatase: 87 U/L (ref 38–126)
Anion gap: 10 (ref 5–15)
BUN: 23 mg/dL — ABNORMAL HIGH (ref 6–20)
CO2: 23 mmol/L (ref 22–32)
Calcium: 9.2 mg/dL (ref 8.9–10.3)
Chloride: 103 mmol/L (ref 98–111)
Creatinine, Ser: 0.47 mg/dL (ref 0.44–1.00)
GFR calc Af Amer: >60 mL/min (ref >60)
GFR calc non Af Amer: >60 mL/min (ref >60)
Glucose, Bld: 120 mg/dL — ABNORMAL HIGH (ref 70–99)
Potassium: 3.9 mmol/L (ref 3.5–5.1)
Sodium: 136 mmol/L (ref 135–145)
Total Bilirubin: 0.4 mg/dL (ref 0.3–1.2)
Total Protein: 7.6 g/dL (ref 6.5–8.1)

## 2019-02-03 LAB — CBC WITH DIFFERENTIAL/PLATELET
Abs Immature Granulocytes: 0.05 10*3/uL (ref 0.00–0.07)
Basophils Absolute: 0 10*3/uL (ref 0.0–0.1)
Basophils Relative: 0 %
Eosinophils Absolute: 0.1 10*3/uL (ref 0.0–0.5)
Eosinophils Relative: 1 %
HCT: 41.4 % (ref 36.0–46.0)
Hemoglobin: 14.3 g/dL (ref 12.0–15.0)
Immature Granulocytes: 0 %
Lymphocytes Relative: 17 %
Lymphs Abs: 2 10*3/uL (ref 0.7–4.0)
MCH: 32 pg (ref 26.0–34.0)
MCHC: 34.5 g/dL (ref 30.0–36.0)
MCV: 92.6 fL (ref 80.0–100.0)
Monocytes Absolute: 1 10*3/uL (ref 0.1–1.0)
Monocytes Relative: 8 %
Neutro Abs: 8.5 10*3/uL — ABNORMAL HIGH (ref 1.7–7.7)
Neutrophils Relative %: 74 %
Platelets: 286 10*3/uL (ref 150–400)
RBC: 4.47 MIL/uL (ref 3.87–5.11)
RDW: 13.6 % (ref 11.5–15.5)
WBC: 11.6 10*3/uL — ABNORMAL HIGH (ref 4.0–10.5)
nRBC: 0 % (ref 0.0–0.2)

## 2019-02-03 LAB — POCT PREGNANCY, URINE: Preg Test, Ur: NEGATIVE

## 2019-02-03 LAB — MONONUCLEOSIS SCREEN: Mono Screen: NEGATIVE

## 2019-02-03 LAB — SARS CORONAVIRUS 2 BY RT PCR (HOSPITAL ORDER, PERFORMED IN ~~LOC~~ HOSPITAL LAB): SARS Coronavirus 2: NEGATIVE

## 2019-02-03 MED ORDER — SODIUM CHLORIDE 0.9% FLUSH
3.0000 mL | Freq: Once | INTRAVENOUS | Status: AC
  Administered 2019-02-03: 10:00:00 3 mL via INTRAVENOUS

## 2019-02-03 MED ORDER — ONDANSETRON HCL 4 MG/2ML IJ SOLN: 4.0000 mg | Freq: Four times a day (QID) | INTRAMUSCULAR | Status: DC | PRN

## 2019-02-03 MED ORDER — ONDANSETRON HCL 4 MG PO TABS: 4.0000 mg | ORAL_TABLET | Freq: Four times a day (QID) | ORAL | Status: DC | PRN

## 2019-02-03 MED ORDER — ONDANSETRON HCL 4 MG/2ML IJ SOLN
4.0000 mg | Freq: Once | INTRAMUSCULAR | Status: AC
  Administered 2019-02-03: 10:00:00 4 mg via INTRAVENOUS
  Filled 2019-02-03: qty 2

## 2019-02-03 MED ORDER — MORPHINE SULFATE (PF) 2 MG/ML IV SOLN
2.0000 mg | Freq: Once | INTRAVENOUS | Status: AC
  Administered 2019-02-03: 2 mg via INTRAVENOUS
  Filled 2019-02-03: qty 1

## 2019-02-03 MED ORDER — PAROXETINE HCL 30 MG PO TABS
30.0000 mg | ORAL_TABLET | Freq: Every day | ORAL | Status: DC
  Administered 2019-02-04: 21:00:00 30 mg via ORAL
  Filled 2019-02-03 (×4): qty 1

## 2019-02-03 MED ORDER — ALBUTEROL SULFATE HFA 108 (90 BASE) MCG/ACT IN AERS: 2.0000 | INHALATION_SPRAY | RESPIRATORY_TRACT | Status: DC | PRN

## 2019-02-03 MED ORDER — LIDOCAINE-EPINEPHRINE 2 %-1:100000 IJ SOLN
20.0000 mL | Freq: Once | INTRAMUSCULAR | Status: AC
  Administered 2019-02-03: 13:00:00 20 mL via INTRADERMAL

## 2019-02-03 MED ORDER — METRONIDAZOLE IN NACL 5-0.79 MG/ML-% IV SOLN
500.0000 mg | Freq: Three times a day (TID) | INTRAVENOUS | Status: DC
  Administered 2019-02-03 – 2019-02-05 (×6): 500 mg via INTRAVENOUS
  Filled 2019-02-03 (×8): qty 100

## 2019-02-03 MED ORDER — PREGABALIN 50 MG PO CAPS
50.0000 mg | ORAL_CAPSULE | Freq: Three times a day (TID) | ORAL | Status: DC
  Administered 2019-02-03 – 2019-02-05 (×6): 50 mg via ORAL
  Filled 2019-02-03 (×6): qty 1

## 2019-02-03 MED ORDER — SODIUM CHLORIDE 0.9 % IV SOLN
1.0000 g | INTRAVENOUS | Status: DC
  Administered 2019-02-03 – 2019-02-04 (×2): 1 g via INTRAVENOUS
  Filled 2019-02-03: qty 1
  Filled 2019-02-03: qty 10
  Filled 2019-02-03: qty 1

## 2019-02-03 MED ORDER — HYDROCODONE-ACETAMINOPHEN 5-325 MG PO TABS
1.0000 | ORAL_TABLET | ORAL | Status: DC | PRN
  Administered 2019-02-03: 16:00:00 2 via ORAL
  Filled 2019-02-03: qty 2

## 2019-02-03 MED ORDER — IOHEXOL 300 MG/ML  SOLN
75.0000 mL | Freq: Once | INTRAMUSCULAR | Status: AC | PRN
  Administered 2019-02-03: 75 mL via INTRAVENOUS
  Filled 2019-02-03: qty 75

## 2019-02-03 MED ORDER — MORPHINE SULFATE (PF) 4 MG/ML IV SOLN
4.0000 mg | Freq: Once | INTRAVENOUS | Status: AC
  Administered 2019-02-03: 4 mg via INTRAVENOUS
  Filled 2019-02-03: qty 1

## 2019-02-03 MED ORDER — TRAZODONE HCL 50 MG PO TABS
200.0000 mg | ORAL_TABLET | Freq: Every day | ORAL | Status: DC
  Administered 2019-02-03 – 2019-02-04 (×2): 200 mg via ORAL
  Filled 2019-02-03 (×2): qty 4

## 2019-02-03 MED ORDER — DEXAMETHASONE SODIUM PHOSPHATE 10 MG/ML IJ SOLN
10.0000 mg | Freq: Once | INTRAMUSCULAR | Status: AC
  Administered 2019-02-03: 12:00:00 10 mg via INTRAMUSCULAR
  Filled 2019-02-03: qty 1

## 2019-02-03 MED ORDER — ACETAMINOPHEN 500 MG PO TABS: 500.0000 mg | ORAL_TABLET | ORAL | Status: DC | PRN

## 2019-02-03 MED ORDER — BENZOCAINE 20 % MT SOLN
Freq: Once | OROMUCOSAL | Status: AC
  Administered 2019-02-03: 13:00:00 via OROMUCOSAL

## 2019-02-03 MED ORDER — DEXAMETHASONE SODIUM PHOSPHATE 10 MG/ML IJ SOLN
4.0000 mg | Freq: Four times a day (QID) | INTRAMUSCULAR | Status: DC
  Administered 2019-02-03 – 2019-02-05 (×8): 4 mg via INTRAVENOUS
  Filled 2019-02-03 (×8): qty 1

## 2019-02-03 MED ORDER — MORPHINE SULFATE (PF) 2 MG/ML IV SOLN
2.0000 mg | INTRAVENOUS | Status: DC | PRN
  Administered 2019-02-03 – 2019-02-04 (×6): 2 mg via INTRAVENOUS
  Filled 2019-02-03 (×6): qty 1

## 2019-02-03 MED ORDER — POLYETHYLENE GLYCOL 3350 17 G PO PACK: 17.0000 g | PACK | Freq: Every day | ORAL | Status: DC | PRN

## 2019-02-03 MED ORDER — CLINDAMYCIN PHOSPHATE 600 MG/50ML IV SOLN
600.0000 mg | Freq: Once | INTRAVENOUS | Status: AC
  Administered 2019-02-03: 600 mg via INTRAVENOUS
  Filled 2019-02-03: qty 50

## 2019-02-03 MED ORDER — ALBUTEROL SULFATE (2.5 MG/3ML) 0.083% IN NEBU: 2.5000 mg | INHALATION_SOLUTION | RESPIRATORY_TRACT | Status: DC | PRN

## 2019-02-03 MED ORDER — DEXTROSE-NACL 5-0.45 % IV SOLN
INTRAVENOUS | Status: DC
  Administered 2019-02-03 – 2019-02-05 (×3): via INTRAVENOUS

## 2019-02-03 MED ORDER — LORAZEPAM 2 MG/ML IJ SOLN
1.0000 mg | Freq: Once | INTRAMUSCULAR | Status: AC
  Administered 2019-02-03: 12:00:00 1 mg via INTRAVENOUS
  Filled 2019-02-03: qty 1

## 2019-02-03 MED ORDER — CYCLOBENZAPRINE HCL 10 MG PO TABS: 5.0000 mg | ORAL_TABLET | Freq: Three times a day (TID) | ORAL | Status: DC | PRN

## 2019-02-03 MED FILL — Benzocaine Mouth/Throat Aerosol 20%: OROMUCOSAL | Qty: 57 | Status: AC

## 2019-02-03 NOTE — H&P (Signed)
[place] [facility] [place] NAME: Lorraine Rose    MR#:  [id number]  DATE OF BIRTH:  1966-01-07  DATE OF ADMISSION:  02/03/2019  PRIMARY CARE PHYSICIAN: Dion Body, MD   REQUESTING/REFERRING PHYSICIAN:   CHIEF COMPLAINT:   Chief Complaint  Patient presents with  . Sore Throat  . Neck Pain    HISTORY OF PRESENT ILLNESS: Lorraine Rose  is a 53 y.o. female with a known history per below presenting with 1 week history of sore throat, seen in congenital clinic today and was thought to have peritonsillar abscess, sent to the ER for further evaluation, CT noted for left peritonsillar abscess, evaluated by ENT whom is planning drainage possibly later today/recommended hospitalist admit, patient evaluated in the emergency room, patient noted to be in mild to moderate distress due to pain, patient is now been admitted for acute left peritonsillar abscess.  PAST MEDICAL HISTORY:   Past Medical History:  Diagnosis Date  . Allergy   . Anxiety    panic attacks  . Depressed affect   . Pulmonary emboli ([place])     PAST SURGICAL HISTORY:  Past Surgical History:  Procedure Laterality Date  . BACK SURGERY     2004, 2006  . CESAREAN SECTION    . CHOLECYSTECTOMY    . NASAL SINUS SURGERY      SOCIAL HISTORY:  Social History   Tobacco Use  . Smoking status: Current Some Day Smoker    Packs/day: 0.20    Types: Cigarettes  . Smokeless tobacco: Never Used  Substance Use Topics  . Alcohol use: No    Alcohol/week: 0.0 standard drinks    FAMILY HISTORY:  Family History  Problem Relation Age of Onset  . Heart disease Mother   . Alzheimer's disease Father     DRUG ALLERGIES:  Allergies  Allergen Reactions  . Codeine Itching  . Penicillins Hives, Swelling and Other (See Comments)    Has patient had a PCN reaction causing immediate rash, facial/tongue/throat swelling, SOB or lightheadedness with hypotension: No Has patient had a PCN reaction  causing severe rash involving mucus membranes or skin necrosis: No Has patient had a PCN reaction that required hospitalization No Has patient had a PCN reaction occurring within the last 10 years: No If all of the above answers are "NO", then may proceed with Cephalosporin use.     REVIEW OF SYSTEMS:   CONSTITUTIONAL: No fever, fatigue or weakness.  EYES: No blurred or double vision.  EARS, NOSE, AND THROAT: No tinnitus or ear pain. + Sore throat RESPIRATORY: No cough, shortness of breath, wheezing or hemoptysis.  CARDIOVASCULAR: No chest pain, orthopnea, edema.  GASTROINTESTINAL: No nausea, vomiting, diarrhea or abdominal pain.  GENITOURINARY: No dysuria, hematuria.  ENDOCRINE: No polyuria, nocturia,  HEMATOLOGY: No anemia, easy bruising or bleeding SKIN: No rash or lesion. MUSCULOSKELETAL: No joint pain or arthritis.   NEUROLOGIC: No tingling, numbness, weakness.  PSYCHIATRY: No anxiety or depression.   MEDICATIONS AT HOME:  Prior to Admission medications   Medication Sig Start Date End Date Taking? Authorizing Provider  acetaminophen (TYLENOL) 500 MG tablet Take 500 mg by mouth every 4 (four) hours as needed for mild pain, fever or headache.     [provider]  acetaminophen (TYLENOL) 500 MG tablet Take by mouth.    [provider]  albuterol (PROVENTIL HFA;VENTOLIN HFA) 108 (90 Base) MCG/ACT inhaler Inhale into the lungs. 12/10/17   [provider]  cyclobenzaprine (FLEXERIL)  10 MG tablet Take 10 mg by mouth 3 (three) times daily as needed.  02/22/18   [provider]  ibuprofen (ADVIL,MOTRIN) 200 MG tablet Take 800 mg by mouth every 6 (six) hours as needed for headache or mild pain.    [provider]  PARoxetine (PAXIL) 30 MG tablet Take 30 mg by mouth at bedtime.     [provider]  pregabalin (LYRICA) 50 MG capsule Take 1 capsule (50 mg total) by mouth 3 (three) times daily. 04/06/18   Gillis Santa, MD  tiZANidine  (ZANAFLEX) 4 MG tablet Take 1 tablet (4 mg total) by mouth 2 (two) times daily as needed for muscle spasms. 04/06/18 04/06/19  Gillis Santa, MD  traZODone (DESYREL) 100 MG tablet Take 200 mg by mouth at bedtime.  10/17/15   [provider]  VENTOLIN HFA 108 (90 Base) MCG/ACT inhaler Inhale 2 puffs into the lungs every 4 (four) hours as needed. 12/11/17   [provider]      PHYSICAL EXAMINATION:   VITAL SIGNS: Blood pressure 115/90, pulse 86, temperature 99.1 F (37.3 C), temperature source Oral, resp. rate 16, height 5\' 6"  (1.676 m), weight 59 kg, last menstrual period 12/17/2018, SpO2 97 %.  GENERAL:  53 y.o.-year-old patient lying in the bed with no acute distress.  EYES: Pupils equal, round, reactive to light and accommodation. No scleral icterus. Extraocular muscles intact.  HEENT: Head atraumatic, normocephalic. Oropharynx and nasopharynx clear.  NECK: supple, no jugular venous distention. No thyroid enlargement, no tenderness. + Lymphadenopathy, tenderness to palpation LUNGS: Normal breath sounds bilaterally, no wheezing, rales,rhonchi or crepitation. No use of accessory muscles of respiration.  CARDIOVASCULAR: S1, S2 normal. No murmurs, rubs, or gallops.  ABDOMEN: Soft, nontender, nondistended. Bowel sounds present. No organomegaly or mass.  EXTREMITIES: No pedal edema, cyanosis, or clubbing.  NEUROLOGIC: Cranial nerves II through XII are intact. Muscle strength 5/5 in all extremities. Sensation intact. Gait not checked.  PSYCHIATRIC: The patient is alert and oriented x 3.  SKIN: No obvious rash, lesion, or ulcer.   LABORATORY PANEL:   CBC Recent Labs  Lab 02/03/19 0938  WBC 11.6*  HGB 14.3  HCT 41.4  PLT 286  MCV 92.6  MCH 32.0  MCHC 34.5  RDW 13.6  LYMPHSABS 2.0  MONOABS 1.0  EOSABS 0.1  BASOSABS 0.0   ------------------------------------------------------------------------------------------------------------------  Chemistries  Recent Labs  Lab  02/03/19 0938  NA 136  K 3.9  CL 103  CO2 23  GLUCOSE 120*  BUN 23*  CREATININE 0.47  CALCIUM 9.2  AST 16  ALT 20  ALKPHOS 87  BILITOT 0.4   ------------------------------------------------------------------------------------------------------------------ estimated creatinine clearance is 76.6 mL/min (by C-G formula based on SCr of 0.47 mg/dL). ------------------------------------------------------------------------------------------------------------------ No results for input(s): TSH, T4TOTAL, T3FREE, THYROIDAB in the last 72 hours.  Invalid input(s): FREET3   Coagulation profile No results for input(s): INR, PROTIME in the last 168 hours. ------------------------------------------------------------------------------------------------------------------- No results for input(s): DDIMER in the last 72 hours. -------------------------------------------------------------------------------------------------------------------  Cardiac Enzymes No results for input(s): CKMB, TROPONINI, MYOGLOBIN in the last 168 hours.  Invalid input(s): CK ------------------------------------------------------------------------------------------------------------------ Invalid input(s): POCBNP  ---------------------------------------------------------------------------------------------------------------  Urinalysis    Component Value Date/Time   COLORURINE Amber 03/29/2012 1537   APPEARANCEUR Cloudy 03/29/2012 1537   LABSPEC 1.042 03/29/2012 1537   PHURINE 5.0 03/29/2012 1537   GLUCOSEU Negative 03/29/2012 1537   HGBUR 2+ 03/29/2012 1537   BILIRUBINUR Negative 03/29/2012 1537   KETONESUR Trace 03/29/2012 1537   PROTEINUR 100 mg/dL 03/29/2012  1537   NITRITE Negative 03/29/2012 1537   LEUKOCYTESUR Negative 03/29/2012 1537     RADIOLOGY: Ct Soft Tissue Neck W Contrast  Addendum Date: 02/03/2019   ADDENDUM REPORT: 02/03/2019 11:54 ADDENDUM: Retropharyngeal effusion bilaterally. No rim  enhancing abscess component. This is most likely a reactive effusion related to pharyngitis. Electronically Signed   By: Franchot Gallo M.D.   On: 02/03/2019 11:54   Result Date: 02/03/2019 CLINICAL DATA:  Left-sided throat pain.  Rule out abscess EXAM: CT NECK WITH CONTRAST TECHNIQUE: Multidetector CT imaging of the neck was performed using the standard protocol following the bolus administration of intravenous contrast. CONTRAST:  19mL OMNIPAQUE IOHEXOL 300 MG/ML  SOLN COMPARISON:  None. FINDINGS: Pharynx and larynx: Soft tissue swelling of the left lateral pharyngeal wall and left tonsil. Elongated fluid collection in the left tonsil extending inferiorly into the lingual tonsil consistent with abscess. Abscess measures approximately 13 mm in diameter in the upper tonsillar region and has a smaller extension into the lingual tonsil on the left. There is effacement of the piriform sinus on the left due to edema. Airway intact. Right pharynx normal. Salivary glands: No inflammation, mass, or stone. Thyroid: Normal. Lymph nodes: Negative for enlarged or pathologic lymph nodes in the neck. Small reactive level 2 lymph nodes bilaterally left greater than right. Vascular: Negative Limited intracranial:  negative Visualized orbits: Negative Mastoids and visualized paranasal sinuses: Negative Skeleton: No acute skeletal abnormality.  Disc prosthesis C5-6. Upper chest: Lung apices clear bilaterally Other: None IMPRESSION: Findings compatible with peritonsillar abscess on the left. Abscess extends from the left tonsil inferiorly into the left lingual tonsil. Left lateral pharyngeal edema with effacement of the piriform sinus. Airway intact. Mild reactive lymph nodes in the neck. Electronically Signed: By: Franchot Gallo M.D. On: 02/03/2019 10:57    EKG: Orders placed or performed during the hospital encounter of 12/17/17  . EKG 12-Lead  . EKG 12-Lead  . ED EKG within 10 minutes  . ED EKG within 10 minutes  . EKG     IMPRESSION AND PLAN: *Acute left peritonsillar abscess Empiric Rocephin/Flagyl, follow-up on cultures, n.p.o. for now, consult ENT/Dr. Von for expert opinion-possible drainage later today, n.p.o. for now, adult pain protocol  *Chronic anxiety/depression Stable Continue current regiment  *Chronic pain syndrome Continue home regiment  DVT prophylaxis-SCDs   All the records are reviewed and case discussed with ED provider. Management plans discussed with the patient, family and they are in agreement.  CODE STATUS:full Code Status History    Date Active Date Inactive Code Status Order ID Comments User Context   01/28/2018 1821 01/29/2018 1854 Full Code [id number]  Saundra Shelling, MD Inpatient   05/04/2016 0358 05/05/2016 1540 Full Code [id number]  Lance Coon, MD Inpatient   Advance Care Planning Activity       TOTAL TIME TAKING CARE OF THIS PATIENT: 40 minutes.    Avel Peace Dasha Kawabata M.D on 02/03/2019   Between 7am to 6pm - Pager - [phone]  After 6pm go to [email or web address] - password [email or web address] [facility]  Office  [phone]  CC: Primary care physician; Dion Body, MD   Note: This dictation was prepared with Dragon dictation along with smaller phrase technology. Any transcriptional errors that result from this process are unintentional.

## 2019-02-03 NOTE — Progress Notes (Signed)
Family Meeting Note  Advance Directive:yes  Today a meeting took place with the Patient.  Patient is able to participate  The following clinical team members were present during this meeting:MD  The following were discussed:Patient's diagnosis: ,53 y.o. female with a known history per below presenting with 1 week history of sore throat, seen in congenital clinic today and was thought to have peritonsillar abscess, sent to the ER for further evaluation, CT noted for left peritonsillar abscess, evaluated by ENT whom is planning drainage possibly later today/recommended hospitalist admit, patient evaluated in the emergency room, patient noted to be in mild to moderate distress due to pain, patient is now been admitted for acute left peritonsillar abscess, Patient's progosis: Unable to determine and Goals for treatment: Full Code  Additional follow-up to be provided: prn  Time spent during discussion:20 minutes  Gorden Harms, MD

## 2019-02-03 NOTE — ED Triage Notes (Signed)
First RN Note: Pt presents to ED via wheelchair from [facility]. Per PA concerns for abscess to L tonsil due to c/o sore throat and swelling to L side of throat with fevers.

## 2019-02-03 NOTE — ED Notes (Addendum)
ED TO INPATIENT HANDOFF REPORT  ED Nurse Name and Phone #: Halford Decamp RN  [place] Name/Age/Gender Lorraine Rose 53 y.o. female Room/Bed: ED54A/ED54A  Code Status   Code Status: Prior  Home/SNF/Other Home Patient oriented to: self, place, time and situation Is this baseline? Yes   Triage Complete: Triage complete  Chief Complaint sore throat  Triage Note First RN Note: Pt presents to ED via wheelchair from [facility]. Per PA concerns for abscess to L tonsil due to c/o sore throat and swelling to L side of throat with fevers.   Sent from [facility] for possible abscess iin throat, left side with ct scan and iv antibiotics.  Has had fever.  Says was put on a z pack   Allergies Allergies  Allergen Reactions  . Penicillins Hives, Swelling and Other (See Comments)    Has patient had a PCN reaction causing immediate rash, facial/tongue/throat swelling, SOB or lightheadedness with hypotension: No Has patient had a PCN reaction causing severe rash involving mucus membranes or skin necrosis: No Has patient had a PCN reaction that required hospitalization No Has patient had a PCN reaction occurring within the last 10 years: No If all of the above answers are "NO", then may proceed with Cephalosporin use.  Other reaction(s): SWELLING   . Codeine Itching    Level of Care/Admitting Diagnosis ED Disposition    ED Disposition Condition [facility] Area: [place] [[id number]]  Level of Care: Med-Surg [16]  Covid Evaluation: Screening Protocol (No Symptoms)  Diagnosis: Peritonsillar abscess [475.ICD-9-CM]  Admitting Physician: Gorden Harms [[id number]]  Attending Physician: Gorden Harms [[id number]]  Estimated length of stay: past midnight tomorrow  Certification:: I certify this patient will need inpatient services for at least 2 midnights  PT Class (Do Not Modify): Inpatient [101]  PT Acc Code (Do Not Modify): Private [1]        B Medical/Surgery History Past Medical History:  Diagnosis Date  . Allergy   . Anxiety    panic attacks  . Depressed affect   . Pulmonary emboli [facility])    Past Surgical History:  Procedure Laterality Date  . BACK SURGERY     2004, 2006  . CESAREAN SECTION    . CHOLECYSTECTOMY    . NASAL SINUS SURGERY       A IV Location/Drains/Wounds Patient Lines/Drains/Airways Status   Active Line/Drains/Airways    Name:   Placement date:   Placement time:   Site:   Days:   Peripheral IV 02/03/19 Right Forearm   02/03/19    0937    Forearm   less than 1          Intake/Output Last 24 hours No intake or output data in the 24 hours ending 02/03/19 1331  Labs/Imaging Results for orders placed or performed during the hospital encounter of 02/03/19 (from the past 48 hour(s))  Comprehensive metabolic panel     Status: Abnormal   Collection Time: 02/03/19  9:38 AM  Result Value Ref Range   Sodium 136 135 - 145 mmol/L   Potassium 3.9 3.5 - 5.1 mmol/L   Chloride 103 98 - 111 mmol/L   CO2 23 22 - 32 mmol/L   Glucose, Bld 120 (H) 70 - 99 mg/dL   BUN 23 (H) 6 - 20 mg/dL   Creatinine, Ser 0.47 0.44 - 1.00 mg/dL   Calcium 9.2 8.9 - 10.3 mg/dL   Total Protein 7.6 6.5 - 8.1  g/dL   Albumin 3.8 3.5 - 5.0 g/dL   AST 16 15 - 41 U/L   ALT 20 0 - 44 U/L   Alkaline Phosphatase 87 38 - 126 U/L   Total Bilirubin 0.4 0.3 - 1.2 mg/dL   GFR calc non Af Amer >60 >60 mL/min   GFR calc Af Amer >60 >60 mL/min   Anion gap 10 5 - 15    Comment: Performed at [facility] [address] [place], [place] [address]  CBC with Differential     Status: Abnormal   Collection Time: 02/03/19  9:38 AM  Result Value Ref Range   WBC 11.6 (H) 4.0 - 10.5 K/uL   RBC 4.47 3.87 - 5.11 MIL/uL   Hemoglobin 14.3 12.0 - 15.0 g/dL   HCT 41.4 36.0 - 46.0 %   MCV 92.6 80.0 - 100.0 fL   MCH 32.0 26.0 - 34.0 pg   MCHC 34.5 30.0 - 36.0 g/dL   RDW 13.6 11.5 - 15.5 %   Platelets 286 150 - 400 K/uL   nRBC 0.0  0.0 - 0.2 %   Neutrophils Relative % 74 %   Neutro Abs 8.5 (H) 1.7 - 7.7 K/uL   Lymphocytes Relative 17 %   Lymphs Abs 2.0 0.7 - 4.0 K/uL   Monocytes Relative 8 %   Monocytes Absolute 1.0 0.1 - 1.0 K/uL   Eosinophils Relative 1 %   Eosinophils Absolute 0.1 0.0 - 0.5 K/uL   Basophils Relative 0 %   Basophils Absolute 0.0 0.0 - 0.1 K/uL   Immature Granulocytes 0 %   Abs Immature Granulocytes 0.05 0.00 - 0.07 K/uL    Comment: Performed at [facility], [address] [place], [place] [address]  Mononucleosis screen     Status: None   Collection Time: 02/03/19 10:09 AM  Result Value Ref Range   Mono Screen NEGATIVE NEGATIVE    Comment: Performed at [facility], [address], [place], [place] [address]  SARS Coronavirus 2 (CEPHEID - Performed in [place] hospital lab), Hosp Order     Status: None   Collection Time: 02/03/19 11:52 AM   Specimen: Nasopharyngeal Swab  Result Value Ref Range   SARS Coronavirus 2 NEGATIVE NEGATIVE    Comment: (NOTE) If result is NEGATIVE SARS-CoV-2 target nucleic acids are NOT DETECTED. The SARS-CoV-2 RNA is generally detectable in upper and lower  respiratory specimens during the acute phase of infection. The lowest  concentration of SARS-CoV-2 viral copies this assay can detect is 250  copies / mL. A negative result does not preclude SARS-CoV-2 infection  and should not be used as the sole basis for treatment or other  patient management decisions.  A negative result may occur with  improper specimen collection / handling, submission of specimen other  than nasopharyngeal swab, presence of viral mutation(s) within the  areas targeted by this assay, and inadequate number of viral copies  (<250 copies / mL). A negative result must be combined with clinical  observations, patient history, and epidemiological information. If result is POSITIVE SARS-CoV-2 target nucleic acids are DETECTED. The SARS-CoV-2 RNA is generally  detectable in upper and lower  respiratory specimens dur ing the acute phase of infection.  Positive  results are indicative of active infection with SARS-CoV-2.  Clinical  correlation with patient history and other diagnostic information is  necessary to determine patient infection status.  Positive results do  not rule out bacterial infection or co-infection with other  viruses. If result is PRESUMPTIVE POSTIVE SARS-CoV-2 nucleic acids MAY BE PRESENT.   A presumptive positive result was obtained on the submitted specimen  and confirmed on repeat testing.  While 2019 novel coronavirus  (SARS-CoV-2) nucleic acids may be present in the submitted sample  additional confirmatory testing may be necessary for epidemiological  and / or clinical management purposes  to differentiate between  SARS-CoV-2 and other Sarbecovirus currently known to infect humans.  If clinically indicated additional testing with an alternate test  methodology [phone]) is advised. The SARS-CoV-2 RNA is generally  detectable in upper and lower respiratory sp ecimens during the acute  phase of infection. The expected result is Negative. Fact Sheet for Patients:  StrictlyIdeas.no Fact Sheet for Healthcare Providers: BankingDealers.co.za This test is not yet approved or cleared by the [place] FDA and has been authorized for detection and/or diagnosis of SARS-CoV-2 by FDA under an Emergency Use Authorization (EUA).  This EUA will remain in effect (meaning this test can be used) for the duration of the COVID-19 declaration under Section 564(b)(1) of the Act, 21 U.S.C. section 360bbb-3(b)(1), unless the authorization is terminated or revoked sooner. Performed at [facility], [place], [place] [address]    Ct Soft Tissue Neck W Contrast  Addendum Date: 02/03/2019   ADDENDUM REPORT: 02/03/2019 11:54 ADDENDUM: Retropharyngeal effusion  bilaterally. No rim enhancing abscess component. This is most likely a reactive effusion related to pharyngitis. Electronically Signed   By: Franchot Gallo M.D.   On: 02/03/2019 11:54   Result Date: 02/03/2019 CLINICAL DATA:  Left-sided throat pain.  Rule out abscess EXAM: CT NECK WITH CONTRAST TECHNIQUE: Multidetector CT imaging of the neck was performed using the standard protocol following the bolus administration of intravenous contrast. CONTRAST:  65mL OMNIPAQUE IOHEXOL 300 MG/ML  SOLN COMPARISON:  None. FINDINGS: Pharynx and larynx: Soft tissue swelling of the left lateral pharyngeal wall and left tonsil. Elongated fluid collection in the left tonsil extending inferiorly into the lingual tonsil consistent with abscess. Abscess measures approximately 13 mm in diameter in the upper tonsillar region and has a smaller extension into the lingual tonsil on the left. There is effacement of the piriform sinus on the left due to edema. Airway intact. Right pharynx normal. Salivary glands: No inflammation, mass, or stone. Thyroid: Normal. Lymph nodes: Negative for enlarged or pathologic lymph nodes in the neck. Small reactive level 2 lymph nodes bilaterally left greater than right. Vascular: Negative Limited intracranial:  negative Visualized orbits: Negative Mastoids and visualized paranasal sinuses: Negative Skeleton: No acute skeletal abnormality.  Disc prosthesis C5-6. Upper chest: Lung apices clear bilaterally Other: None IMPRESSION: Findings compatible with peritonsillar abscess on the left. Abscess extends from the left tonsil inferiorly into the left lingual tonsil. Left lateral pharyngeal edema with effacement of the piriform sinus. Airway intact. Mild reactive lymph nodes in the neck. Electronically Signed: By: Franchot Gallo M.D. On: 02/03/2019 10:57    Pending Labs Unresulted Labs (From admission, onward)    Start     Ordered   Signed and Held  HIV antibody (Routine Testing)  Once,   R     Signed  and Held          Vitals/Pain Today's Vitals   02/03/19 0927 02/03/19 0928 02/03/19 1246 02/03/19 1247  BP: 115/90   118/78  Pulse: 86   83  Resp: 16   16  Temp: 99.1 F (37.3 C)     TempSrc: Oral  SpO2: 97%   97%  Weight:  59 kg    Height:  5\' 6"  (1.676 m)    PainSc:  10-Worst pain ever 6      Isolation Precautions No active isolations  Medications Medications  dexamethasone (DECADRON) injection 4 mg (4 mg Intravenous Not Given 02/03/19 1233)  morphine 2 MG/ML injection 2 mg (has no administration in time range)  sodium chloride flush (NS) 0.9 % injection 3 mL (3 mLs Intravenous Given by Other 02/03/19 0951)  morphine 4 MG/ML injection 4 mg (4 mg Intravenous Given 02/03/19 1011)  ondansetron (ZOFRAN) injection 4 mg (4 mg Intravenous Given 02/03/19 1020)  iohexol (OMNIPAQUE) 300 MG/ML solution 75 mL (75 mLs Intravenous Contrast Given 02/03/19 1027)  clindamycin (CLEOCIN) IVPB 600 mg (0 mg Intravenous Stopped 02/03/19 1232)  dexamethasone (DECADRON) injection 10 mg (10 mg Intramuscular Given 02/03/19 1156)  LORazepam (ATIVAN) injection 1 mg (1 mg Intravenous Given 02/03/19 1214)  morphine 2 MG/ML injection 2 mg (2 mg Intravenous Given 02/03/19 1214)  benzocaine (HURRICAINE) 20 % mouth spray ( Mouth/Throat Given by Other 02/03/19 1313)  lidocaine-EPINEPHrine (XYLOCAINE W/EPI) 2 %-1:100000 (with pres) injection 20 mL (20 mLs Intradermal Given by Other 02/03/19 1313)    Mobility walks Low fall risk   Focused Assessments    R Recommendations: See Admitting Provider Note  Report given to: Rhonette RN  Additional Notes:   Report given to [facility] RN

## 2019-02-03 NOTE — ED Triage Notes (Signed)
Says was put on a z pack

## 2019-02-03 NOTE — ED Triage Notes (Signed)
Sent from [facility] for possible abscess iin throat, left side with ct scan and iv antibiotics.  Has had fever.

## 2019-02-03 NOTE — ED Notes (Signed)
Dr Merla Riches in with pt

## 2019-02-03 NOTE — Consult Note (Addendum)
.. Lorraine Rose, Lorraine Rose [id number] 1966-06-30 Earleen Newport, MD  Reason for Consult: Peritonsillar abscess, pharyngitis, retropharyngeal phlegmon  HPI: 53 year old female presents for evalaution of 1 week history of sore throat.  Reports difficulty with painful swallowing worsening over this time.  This morning was the worst and presented to ED.  No difficulty with breathing.  Reports fever.  No sick contacts that patient knows about.  Allergies:  Allergies  Allergen Reactions  . Codeine Itching  . Penicillins Hives, Swelling and Other (See Comments)    Has patient had a PCN reaction causing immediate rash, facial/tongue/throat swelling, SOB or lightheadedness with hypotension: No Has patient had a PCN reaction causing severe rash involving mucus membranes or skin necrosis: No Has patient had a PCN reaction that required hospitalization No Has patient had a PCN reaction occurring within the last 10 years: No If all of the above answers are "NO", then may proceed with Cephalosporin use.     ROS: Review of systems normal other than 12 systems except per HPI.  PMH:  Past Medical History:  Diagnosis Date  . Allergy   . Anxiety    panic attacks  . Depressed affect   . Pulmonary emboli (HCC)     FH:  Family History  Problem Relation Age of Onset  . Heart disease Mother   . Alzheimer's disease Father     SH:  Social History   Socioeconomic History  . Marital status: Married    Spouse name: Not on file  . Number of children: Not on file  . Years of education: Not on file  . Highest education level: Not on file  Occupational History  . Not on file  Social Needs  . Financial resource strain: Not on file  . Food insecurity    Worry: Not on file    Inability: Not on file  . Transportation needs    Medical: Not on file    Non-medical: Not on file  Tobacco Use  . Smoking status: Current Some Day Smoker    Packs/day: 0.20    Types: Cigarettes  . Smokeless tobacco:  Never Used  Substance and Sexual Activity  . Alcohol use: No    Alcohol/week: 0.0 standard drinks  . Drug use: No    Comment: h/o opioid abuse  . Sexual activity: Not on file  Lifestyle  . Physical activity    Days per week: Not on file    Minutes per session: Not on file  . Stress: Not on file  Relationships  . Social Herbalist on phone: Not on file    Gets together: Not on file    Attends religious service: Not on file    Active member of club or organization: Not on file    Attends meetings of clubs or organizations: Not on file    Relationship status: Not on file  . Intimate partner violence    Fear of current or ex partner: Not on file    Emotionally abused: Not on file    Physically abused: Not on file    Forced sexual activity: Not on file  Other Topics Concern  . Not on file  Social History Narrative  . Not on file    PSH:  Past Surgical History:  Procedure Laterality Date  . BACK SURGERY     2004, 2006  . CESAREAN SECTION    . CHOLECYSTECTOMY    . NASAL SINUS SURGERY  Physical  Exam:  GEN-  Supine in bed.  NAD NEURO-  CN 2-12 grossly intact and symmetric. EARS-  External ears clear NOSE-  Clear anteriorly OC/OP-  Mildly enlarged left tonsil with some deviation and fullness lateral to left tonsil.  Firmness and tenderness to palpation. NECK-  Left sided neck swelling RESP-  Unlabored.  No stridor.  CT-  Left peritonsillar abscess extending down into base of tongue with resulting pharyngeal swelling as well as extension into pyriform sinus.  Airway patent.  A/P: Peritonsillar/Base of tongue abscess with retropharyngeal edema  Plan:  Recommend admission for IV abx and steroids.  Recommend attempted drain here in ER.  Jeannie Fend Iantha Titsworth 02/03/2019 12:03 PM   Procedure:  After verbal consent obtained, the patient's left oral cavity was anesthetized with topical Hurricaine spray.  37ml of 1% Lidocaine with 1:100,000 epinephrine was injected  just lateral to the superior pole of left tonsil with 25 gauge needle.  At this time an 18 gauge needle as advance just lateral to the left tonsil.  This demonstrated scant purulent drainage.  This was duplicated x 2 with minimal drainage.  Patient tolerated the procedure well.

## 2019-02-03 NOTE — ED Provider Notes (Signed)
[facility] Emergency Department Provider Note   ____________________________________________   First MD Initiated Contact with Patient 02/03/19 0945     (approximate)  I have reviewed the triage vital signs and the nursing notes.   HISTORY  Chief Complaint Sore Throat and Neck Pain    HPI Lorraine Rose is a 53 y.o. female patient presents with sore throat and edematous left tonsil.  Onset of complaint was 5 days ago.  Patient had a negative strep test.  Patient was again seen by PCP 3 days ago placed on Zithromax.  Patient was reevaluated today at the urgent care clinic and sent to the ED due to increased tonsillar edema, increased pain, and fever.  Patient rates pain as a 10/10.  Patient described pain as "sore".  Patient can tolerate fluids but nothing solid.  No palliative measure for complaint.  Patient denies URI signs and symptoms.         Past Medical History:  Diagnosis Date   Allergy    Anxiety    panic attacks   Depressed affect    Pulmonary emboli [facility])     Patient Active Problem List   Diagnosis Date Noted   Peritonsillar abscess 02/03/2019   Pulmonary embolism ([place]) 01/28/2018   Anaphylaxis 05/04/2016   Anxiety 05/04/2016   Depression 05/04/2016   DDD (degenerative disc disease), lumbar 10/23/2015   Status post lumbar laminectomy 10/23/2015   Status post lumbar spinal fusion 10/23/2015   Facet syndrome, lumbar 10/23/2015   Lumbar radiculopathy 10/23/2015   Sacroiliac joint dysfunction 10/23/2015   Depressed affect     Past Surgical History:  Procedure Laterality Date   BACK SURGERY     2004, 2006   CESAREAN SECTION     CHOLECYSTECTOMY     NASAL SINUS SURGERY      Prior to Admission medications   Medication Sig Start Date End Date Taking? Authorizing Provider  azithromycin (ZITHROMAX) 250 MG tablet Take 250-500 mg by mouth daily. 01/31/19 02/04/19 Yes [provider]  cyclobenzaprine  (FLEXERIL) 10 MG tablet Take 10 mg by mouth 3 (three) times daily as needed.  02/22/18  Yes [provider]  PARoxetine (PAXIL) 30 MG tablet Take 30 mg by mouth at bedtime.    Yes [provider]  pregabalin (LYRICA) 50 MG capsule Take 1 capsule (50 mg total) by mouth 3 (three) times daily. 04/06/18  Yes Gillis Santa, MD  promethazine (PHENERGAN) 12.5 MG tablet Take 12.5-25 mg by mouth every 6 (six) hours as needed for nausea/vomiting. 01/29/19  Yes [provider]  traZODone (DESYREL) 100 MG tablet Take 200 mg by mouth at bedtime.  10/17/15  Yes [provider]  acetaminophen (TYLENOL) 500 MG tablet Take 500 mg by mouth every 4 (four) hours as needed for mild pain, fever or headache.     [provider]  albuterol (PROVENTIL HFA;VENTOLIN HFA) 108 (90 Base) MCG/ACT inhaler Inhale into the lungs. 12/10/17   [provider]  apixaban (ELIQUIS) 5 MG TABS tablet Take 5 mg by mouth every 12 (twelve) hours. 06/02/18   [provider]  ibuprofen (ADVIL,MOTRIN) 200 MG tablet Take 800 mg by mouth every 6 (six) hours as needed for headache or mild pain.    [provider]  tiZANidine (ZANAFLEX) 4 MG tablet Take 1 tablet (4 mg total) by mouth 2 (two) times daily as needed for muscle spasms. Patient not taking: Reported on 02/03/2019 04/06/18 04/06/19  Gillis Santa, MD  Allergies Penicillins and Codeine  Family History  Problem Relation Age of Onset   Heart disease Mother    Alzheimer's disease Father     Social History Social History   Tobacco Use   Smoking status: Current Some Day Smoker    Packs/day: 0.20    Types: Cigarettes   Smokeless tobacco: Never Used  Substance Use Topics   Alcohol use: No    Alcohol/week: 0.0 standard drinks   Drug use: No    Comment: h/o opioid abuse    Review of Systems Constitutional: No fever. Eyes: No visual changes. ENT: Sore throat. Cardiovascular: Denies chest pain. Respiratory:  Denies shortness of breath. Gastrointestinal: No abdominal pain.  No nausea, no vomiting.  No diarrhea.  No constipation. Genitourinary: Negative for dysuria. Musculoskeletal: Negative for back pain. Skin: Negative for rash. Neurological: Negative for headaches, focal weakness or numbness. Psychiatric: Anxiety depression.  Allergic/Immunilogical: Penicillin ____________________________________________   PHYSICAL EXAM:  VITAL SIGNS: ED Triage Vitals  Enc Vitals Group     BP 02/03/19 0927 115/90     Pulse Rate 02/03/19 0927 86     Resp 02/03/19 0927 16     Temp 02/03/19 0927 99.1 F (37.3 C)     Temp Source 02/03/19 0927 Oral     SpO2 02/03/19 0927 97 %     Weight 02/03/19 0928 130 lb (59 kg)     Height 02/03/19 0928 5\' 6"  (1.676 m)     Head Circumference --      Peak Flow --      Pain Score 02/03/19 0928 10     Pain Loc --      Pain Edu? --      Excl. in [place]? --    Constitutional: Alert and oriented. Well appearing and in no acute distress. Mouth/Throat: Mucous membranes are moist.  Oropharynx erythematous.  Edematous left tonsil and uvula deviation to the right. Neck: No stridor.  Edema to the upper portion of the neck just below the left mandible.  Area is nonfluctuant.  Hematological/Lymphatic/Immunilogical: No cervical lymphadenopathy. Cardiovascular: Normal rate, regular rhythm. Grossly normal heart sounds.  Good peripheral circulation. Respiratory: Normal respiratory effort.  No retractions. Lungs CTAB. Gastrointestinal: Soft and nontender. No distention. No abdominal bruits. No CVA tenderness. Neurologic:  Normal speech and language. No gross focal neurologic deficits are appreciated. No gait instability. Skin:  Skin is warm, dry and intact. No rash noted. Psychiatric: Mood and affect are normal. Speech and behavior are normal.  ____________________________________________   LABS (all labs ordered are listed, but only abnormal results are displayed)  Labs Reviewed    COMPREHENSIVE METABOLIC PANEL - Abnormal; Notable for the following components:      Result Value   Glucose, Bld 120 (*)    BUN 23 (*)    All other components within normal limits  CBC WITH DIFFERENTIAL/PLATELET - Abnormal; Notable for the following components:   WBC 11.6 (*)    Neutro Abs 8.5 (*)    All other components within normal limits  SARS CORONAVIRUS 2 (HOSPITAL ORDER, [facility])  MONONUCLEOSIS SCREEN  HIV ANTIBODY (ROUTINE TESTING W REFLEX)  POC URINE PREG, ED   ____________________________________________  EKG   ____________________________________________  RADIOLOGY  ED MD interpretation:    Official radiology report(s): Ct Soft Tissue Neck W Contrast  Addendum Date: 02/03/2019   ADDENDUM REPORT: 02/03/2019 11:54 ADDENDUM: Retropharyngeal effusion bilaterally. No rim enhancing abscess component. This is most likely a reactive effusion related to pharyngitis.  Electronically Signed   By: Franchot Gallo M.D.   On: 02/03/2019 11:54   Result Date: 02/03/2019 CLINICAL DATA:  Left-sided throat pain.  Rule out abscess EXAM: CT NECK WITH CONTRAST TECHNIQUE: Multidetector CT imaging of the neck was performed using the standard protocol following the bolus administration of intravenous contrast. CONTRAST:  54mL OMNIPAQUE IOHEXOL 300 MG/ML  SOLN COMPARISON:  None. FINDINGS: Pharynx and larynx: Soft tissue swelling of the left lateral pharyngeal wall and left tonsil. Elongated fluid collection in the left tonsil extending inferiorly into the lingual tonsil consistent with abscess. Abscess measures approximately 13 mm in diameter in the upper tonsillar region and has a smaller extension into the lingual tonsil on the left. There is effacement of the piriform sinus on the left due to edema. Airway intact. Right pharynx normal. Salivary glands: No inflammation, mass, or stone. Thyroid: Normal. Lymph nodes: Negative for enlarged or pathologic lymph nodes in the  neck. Small reactive level 2 lymph nodes bilaterally left greater than right. Vascular: Negative Limited intracranial:  negative Visualized orbits: Negative Mastoids and visualized paranasal sinuses: Negative Skeleton: No acute skeletal abnormality.  Disc prosthesis C5-6. Upper chest: Lung apices clear bilaterally Other: None IMPRESSION: Findings compatible with peritonsillar abscess on the left. Abscess extends from the left tonsil inferiorly into the left lingual tonsil. Left lateral pharyngeal edema with effacement of the piriform sinus. Airway intact. Mild reactive lymph nodes in the neck. Electronically Signed: By: Franchot Gallo M.D. On: 02/03/2019 10:57    ____________________________________________   PROCEDURES  Procedure(s) performed (including Critical Care):  Procedures   ____________________________________________   INITIAL IMPRESSION / ASSESSMENT AND PLAN / ED COURSE  As part of my medical decision making, I reviewed the following data within the [place]         Patient presents with 1 week of increasing sore throat and enlarged left tonsil.  Patient was sent to urgent care clinic secondary to complaint.  Patient refractory to Zithromax which is taken for 3 days.  Differential consist of tonsillar abscess versus viral pharyngitis.  Patient be further evaluated with IV contrast CT of the neck.   Discussed CT results with patient and Dr.Vaught.  Patient will be admitted by hospitalist with a consult to Dr. Pryor Ochoa for left tonsillar abscess.   ____________________________________________   FINAL CLINICAL IMPRESSION(S) / ED DIAGNOSES  Final diagnoses:  Tonsillar abscess     ED Discharge Orders    None       Note:  This document was prepared using Dragon voice recognition software and may include unintentional dictation errors.    Sable Feil, PA-C 02/03/19 1535    Earleen Newport, MD 02/04/19 [phone]

## 2019-02-04 MED ORDER — APIXABAN 5 MG PO TABS: 5.0000 mg | ORAL_TABLET | Freq: Two times a day (BID) | ORAL | Status: DC

## 2019-02-04 MED ORDER — MORPHINE SULFATE (PF) 2 MG/ML IV SOLN
1.0000 mg | Freq: Once | INTRAVENOUS | Status: AC
  Administered 2019-02-04: 07:00:00 1 mg via INTRAVENOUS

## 2019-02-04 MED ORDER — MORPHINE SULFATE (PF) 2 MG/ML IV SOLN
2.0000 mg | Freq: Four times a day (QID) | INTRAVENOUS | Status: DC
  Administered 2019-02-04 – 2019-02-05 (×3): 2 mg via INTRAVENOUS
  Filled 2019-02-04 (×3): qty 1

## 2019-02-04 MED ORDER — ALPRAZOLAM 0.5 MG PO TABS
0.5000 mg | ORAL_TABLET | Freq: Two times a day (BID) | ORAL | Status: DC | PRN
  Administered 2019-02-04 – 2019-02-05 (×2): 0.5 mg via ORAL
  Filled 2019-02-04 (×2): qty 1

## 2019-02-04 MED ORDER — MORPHINE SULFATE (PF) 2 MG/ML IV SOLN
INTRAVENOUS | Status: AC
  Filled 2019-02-04: qty 1

## 2019-02-04 MED ORDER — HYDROCODONE-ACETAMINOPHEN 7.5-325 MG/15ML PO SOLN
15.0000 mL | ORAL | Status: DC | PRN
  Administered 2019-02-04 – 2019-02-05 (×6): 15 mL via ORAL
  Filled 2019-02-04 (×6): qty 15

## 2019-02-04 MED ORDER — APIXABAN 5 MG PO TABS
5.0000 mg | ORAL_TABLET | Freq: Two times a day (BID) | ORAL | Status: DC
  Filled 2019-02-04: qty 1

## 2019-02-04 NOTE — Progress Notes (Signed)
I spoke with ENT, okay with restarting Eliquis. , will start soft diet also.

## 2019-02-04 NOTE — Progress Notes (Signed)
Pt made NPO for procedure in AM. Dorna Bloom RN

## 2019-02-04 NOTE — Progress Notes (Signed)
Pt states Morphine is not working and asked that I page her doctor.  Text messaged hospitalist and then phone paged.  Awaiting answer. Dorna Bloom RN

## 2019-02-04 NOTE — Progress Notes (Addendum)
[place] [facility] [place] NAME: Lorraine Rose    MR#:  [id number]  DATE OF BIRTH:  [birth year]  SUBJECTIVE: Admitted for peritonsillar abscess, started on IV antibiotics, IV Decadron, patient is crying this morning saying that her pain is not well controlled, patient already on morphine as needed ordered.  CHIEF COMPLAINT:   Chief Complaint  Patient presents with  . Sore Throat  . Neck Pain  No fever  REVIEW OF SYSTEMS:   Review of Systems  Constitutional: Negative for chills and fever.  HENT: Positive for sore throat. Negative for hearing loss.   Eyes: Negative for blurred vision, double vision and photophobia.  Respiratory: Negative for cough, hemoptysis and shortness of breath.   Cardiovascular: Negative for palpitations, orthopnea and leg swelling.  Gastrointestinal: Negative for abdominal pain, diarrhea and vomiting.  Genitourinary: Negative for dysuria and urgency.  Musculoskeletal: Positive for neck pain. Negative for myalgias.  Skin: Negative for rash.  Neurological: Negative for dizziness, focal weakness, seizures, weakness and headaches.  Psychiatric/Behavioral: Negative for memory loss. The patient is nervous/anxious. The patient does not have insomnia.    CONSTITUTIONAL: No fever, fatigue or weakness.  EYES: No blurred or double vision.  EARS, NOSE, AND THROAT: No tinnitus or ear pain.  Patient edema on the left tonsil noted but according to her the swelling is slightly down compared to yesterday.  Complains of pain on left side of neck. RESPIRATORY: No cough, shortness of breath, wheezing or hemoptysis.  CARDIOVASCULAR: No chest pain, orthopnea, edema.  GASTROINTESTINAL: No nausea, vomiting, diarrhea or abdominal pain.  GENITOURINARY: No dysuria, hematuria.  ENDOCRINE: No polyuria, nocturia,  HEMATOLOGY: No anemia, easy bruising or bleeding SKIN: No rash or lesion. MUSCULOSKELETAL: No joint pain or arthritis.    NEUROLOGIC: No tingling, numbness, weakness.  PSYCHIATRY: No anxiety or depression.   DRUG ALLERGIES:   Allergies  Allergen Reactions  . Penicillins Hives, Swelling and Other (See Comments)    Has patient had a PCN reaction causing immediate rash, facial/tongue/throat swelling, SOB or lightheadedness with hypotension: No Has patient had a PCN reaction causing severe rash involving mucus membranes or skin necrosis: No Has patient had a PCN reaction that required hospitalization No Has patient had a PCN reaction occurring within the last 10 years: No If all of the above answers are "NO", then may proceed with Cephalosporin use.  Other reaction(s): SWELLING   . Codeine Itching    VITALS:  Blood pressure (!) 125/91, pulse 69, temperature 98.3 F (36.8 C), temperature source Oral, resp. rate 16, height 5\' 6"  (1.676 m), weight 59 kg, last menstrual period 12/17/2018, SpO2 95 %.  PHYSICAL EXAMINATION:  GENERAL:  53 y.o.-year-old patient lying in the bed with some distress secondary to pain in the left tonsillar area.  EYES: Pupils equal, round, reactive to light and accommodation. No scleral icterus. Extraocular muscles intact.  HEENT: Head atraumatic, normocephalic. Oropharynx and nasopharynx clear.  Patient has swelling of left tonsil, left side of the neck slightly tender to palpation but did not see any erythema. NECK:  Supple, no jugular venous distention. No thyroid enlargement, no tenderness.  LUNGS: Normal breath sounds bilaterally, no wheezing, rales,rhonchi or crepitation. No use of accessory muscles of respiration.  CARDIOVASCULAR: S1, S2 normal. No murmurs, rubs, or gallops.  ABDOMEN: Soft, nontender, nondistended. Bowel sounds present. No organomegaly or mass.  EXTREMITIES: No pedal edema, cyanosis, or clubbing.  NEUROLOGIC: Cranial nerves II through XII are intact. Muscle strength 5/5 in  all extremities. Sensation intact. Gait not checked.  PSYCHIATRIC: The patient is alert  and oriented x 3.  SKIN: No obvious rash, lesion, or ulcer.    LABORATORY PANEL:   CBC Recent Labs  Lab 02/03/19 0938  WBC 11.6*  HGB 14.3  HCT 41.4  PLT 286   ------------------------------------------------------------------------------------------------------------------  Chemistries  Recent Labs  Lab 02/03/19 0938  NA 136  K 3.9  CL 103  CO2 23  GLUCOSE 120*  BUN 23*  CREATININE 0.47  CALCIUM 9.2  AST 16  ALT 20  ALKPHOS 87  BILITOT 0.4   ------------------------------------------------------------------------------------------------------------------  Cardiac Enzymes No results for input(s): TROPONINI in the last 168 hours. ------------------------------------------------------------------------------------------------------------------  RADIOLOGY:  Ct Soft Tissue Neck W Contrast  Addendum Date: 02/03/2019   ADDENDUM REPORT: 02/03/2019 11:54 ADDENDUM: Retropharyngeal effusion bilaterally. No rim enhancing abscess component. This is most likely a reactive effusion related to pharyngitis. Electronically Signed   By: Franchot Gallo M.D.   On: 02/03/2019 11:54   Result Date: 02/03/2019 CLINICAL DATA:  Left-sided throat pain.  Rule out abscess EXAM: CT NECK WITH CONTRAST TECHNIQUE: Multidetector CT imaging of the neck was performed using the standard protocol following the bolus administration of intravenous contrast. CONTRAST:  13mL OMNIPAQUE IOHEXOL 300 MG/ML  SOLN COMPARISON:  None. FINDINGS: Pharynx and larynx: Soft tissue swelling of the left lateral pharyngeal wall and left tonsil. Elongated fluid collection in the left tonsil extending inferiorly into the lingual tonsil consistent with abscess. Abscess measures approximately 13 mm in diameter in the upper tonsillar region and has a smaller extension into the lingual tonsil on the left. There is effacement of the piriform sinus on the left due to edema. Airway intact. Right pharynx normal. Salivary glands: No  inflammation, mass, or stone. Thyroid: Normal. Lymph nodes: Negative for enlarged or pathologic lymph nodes in the neck. Small reactive level 2 lymph nodes bilaterally left greater than right. Vascular: Negative Limited intracranial:  negative Visualized orbits: Negative Mastoids and visualized paranasal sinuses: Negative Skeleton: No acute skeletal abnormality.  Disc prosthesis C5-6. Upper chest: Lung apices clear bilaterally Other: None IMPRESSION: Findings compatible with peritonsillar abscess on the left. Abscess extends from the left tonsil inferiorly into the left lingual tonsil. Left lateral pharyngeal edema with effacement of the piriform sinus. Airway intact. Mild reactive lymph nodes in the neck. Electronically Signed: By: Franchot Gallo M.D. On: 02/03/2019 10:57    EKG:   Orders placed or performed during the hospital encounter of 12/17/17  . EKG 12-Lead  . EKG 12-Lead  . ED EKG within 10 minutes  . ED EKG within 10 minutes  . EKG    ASSESSMENT AND PLAN:   53 year old female with left tonsillar abscess, patient is on IV Rocephin, Decadron, seen by ENT Dr Dr. Jeannie Fend vaught\, attempted peritonsillar abscess drainage yesterday evening, but not successful, because patient swelling slightly improved from yesterday, ENT recommended to continue IV antibiotics, IV Decadron and hopefully it will resolve without any surgical drainage at this time.  Started on clear liquid diet today, also will give morphine 2 mg every 4 hours instead of as needed for better pain control today.  #2 .anxiety, panic attacks: Patient is on  Paxil.,  Resumed that. 3.  History of COPD without any wheezing now continue Ventolin as needed. 4.  COVID-19 test is negative. #5 history of PE, apixaban on hold for possible drainage of left tonsillar abscess.  Restart apixaban as soon as ENT feels like no drainage is needed. All the  records are reviewed and case discussed with Care Management/Social Workerr. Management  plans discussed with the patient, family and they are in agreement.  CODE STATUS: Full code  TOTAL TIME TAKING CARE OF THIS PATIENT: 35 minutes.   POSSIBLE D/C IN 1-2 DAYS, DEPENDING ON CLINICAL CONDITION.   Epifanio Lesches M.D on 02/04/2019 at 11:06 AM  Between 7am to 6pm - Pager - [phone]  After 6pm go to [email or web address] - password [email or web address] [facility]  Office  [phone]  CC: Primary care physician; Dion Body, MD   Note: This dictation was prepared with Dragon dictation along with smaller phrase technology. Any transcriptional errors that result from this process are unintentional.

## 2019-02-04 NOTE — Progress Notes (Signed)
.. 02/04/2019 8:12 AM  Lorraine Rose [id number]  Hospital day #2    Temp:  [98.3 F (36.8 C)-99.1 F (37.3 C)] 98.3 F (36.8 C) (06/26 0522) Pulse Rate:  [69-86] 69 (06/26 0522) Resp:  [16-20] 16 (06/26 0522) BP: (114-125)/(78-91) 125/91 (06/26 0522) SpO2:  [95 %-100 %] 95 % (06/26 0522) Weight:  [59 kg] 59 kg (06/25 0928),     Intake/Output Summary (Last 24 hours) at 02/04/2019 7782 Last data filed at 02/04/2019 0000 Gross per 24 hour  Intake 500.13 ml  Output -  Net 500.13 ml    Results for orders placed or performed during the hospital encounter of 02/03/19 (from the past 24 hour(s))  Comprehensive metabolic panel     Status: Abnormal   Collection Time: 02/03/19  9:38 AM  Result Value Ref Range   Sodium 136 135 - 145 mmol/L   Potassium 3.9 3.5 - 5.1 mmol/L   Chloride 103 98 - 111 mmol/L   CO2 23 22 - 32 mmol/L   Glucose, Bld 120 (H) 70 - 99 mg/dL   BUN 23 (H) 6 - 20 mg/dL   Creatinine, Ser 0.47 0.44 - 1.00 mg/dL   Calcium 9.2 8.9 - 10.3 mg/dL   Total Protein 7.6 6.5 - 8.1 g/dL   Albumin 3.8 3.5 - 5.0 g/dL   AST 16 15 - 41 U/L   ALT 20 0 - 44 U/L   Alkaline Phosphatase 87 38 - 126 U/L   Total Bilirubin 0.4 0.3 - 1.2 mg/dL   GFR calc non Af Amer >60 >60 mL/min   GFR calc Af Amer >60 >60 mL/min   Anion gap 10 5 - 15  CBC with Differential     Status: Abnormal   Collection Time: 02/03/19  9:38 AM  Result Value Ref Range   WBC 11.6 (H) 4.0 - 10.5 K/uL   RBC 4.47 3.87 - 5.11 MIL/uL   Hemoglobin 14.3 12.0 - 15.0 g/dL   HCT 41.4 36.0 - 46.0 %   MCV 92.6 80.0 - 100.0 fL   MCH 32.0 26.0 - 34.0 pg   MCHC 34.5 30.0 - 36.0 g/dL   RDW 13.6 11.5 - 15.5 %   Platelets 286 150 - 400 K/uL   nRBC 0.0 0.0 - 0.2 %   Neutrophils Relative % 74 %   Neutro Abs 8.5 (H) 1.7 - 7.7 K/uL   Lymphocytes Relative 17 %   Lymphs Abs 2.0 0.7 - 4.0 K/uL   Monocytes Relative 8 %   Monocytes Absolute 1.0 0.1 - 1.0 K/uL   Eosinophils Relative 1 %   Eosinophils Absolute 0.1 0.0 - 0.5  K/uL   Basophils Relative 0 %   Basophils Absolute 0.0 0.0 - 0.1 K/uL   Immature Granulocytes 0 %   Abs Immature Granulocytes 0.05 0.00 - 0.07 K/uL  Pregnancy, urine POC     Status: None   Collection Time: 02/03/19  9:41 AM  Result Value Ref Range   Preg Test, Ur NEGATIVE NEGATIVE  Mononucleosis screen     Status: None   Collection Time: 02/03/19 10:09 AM  Result Value Ref Range   Mono Screen NEGATIVE NEGATIVE  SARS Coronavirus 2 (CEPHEID - Performed in [place] hospital lab), Hosp Order     Status: None   Collection Time: 02/03/19 11:52 AM   Specimen: Nasopharyngeal Swab  Result Value Ref Range   SARS Coronavirus 2 NEGATIVE NEGATIVE    SUBJECTIVE:  Patient reports continued pain.  Tolerating PO yesterday.  Reports that morphine is not working  OBJECTIVE:  GEN-  Supine in bed, NAD OC/OP- improved edema of left tonsil with less uvular deviation, improved firmness to palpation on left side as well adjacent to tonsil in area of attempted peritonsillar abscess drainage yesterday pm. NECK-  Continued left level 1/2 swelling just less tender and less pronounced  IMPRESSION:  Peritonsillar/base of tongue abscess/phlegmon with pharyngitits  PLAN:  Slight improvement in exam.  Cleared for PO intake as anticipate medications to continue to have this resolve and hold on surgical drainage at this time.  Recommend improved pain control with oral medications to help improve PO intake.  Lorraine Rose 02/04/2019, 8:12 AM

## 2019-02-05 LAB — CBC
HCT: 39.6 % (ref 36.0–46.0)
Hemoglobin: 13.2 g/dL (ref 12.0–15.0)
MCH: 31.5 pg (ref 26.0–34.0)
MCHC: 33.3 g/dL (ref 30.0–36.0)
MCV: 94.5 fL (ref 80.0–100.0)
Platelets: 336 10*3/uL (ref 150–400)
RBC: 4.19 MIL/uL (ref 3.87–5.11)
RDW: 13.6 % (ref 11.5–15.5)
WBC: 16.3 10*3/uL — ABNORMAL HIGH (ref 4.0–10.5)
nRBC: 0 % (ref 0.0–0.2)

## 2019-02-05 LAB — HIV ANTIBODY (ROUTINE TESTING W REFLEX): HIV Screen 4th Generation wRfx: NONREACTIVE

## 2019-02-05 MED ORDER — METRONIDAZOLE 500 MG PO TABS: 500.0000 mg | ORAL_TABLET | Freq: Three times a day (TID) | ORAL | 0 refills | Status: AC

## 2019-02-05 MED ORDER — CEFDINIR 300 MG PO CAPS: 300.0000 mg | ORAL_CAPSULE | Freq: Two times a day (BID) | ORAL | 0 refills | Status: AC

## 2019-02-05 MED ORDER — HYDROCODONE-ACETAMINOPHEN 5-325 MG PO TABS: 1.0000 | ORAL_TABLET | Freq: Four times a day (QID) | ORAL | 0 refills | Status: DC | PRN

## 2019-02-05 MED ORDER — PREDNISONE 10 MG PO TABS: ORAL_TABLET | ORAL | 0 refills | Status: DC

## 2019-02-05 NOTE — Discharge Summary (Signed)
[facility] [place] NAME: Lorraine Rose    MR#:  [id number]  DATE OF BIRTH:  Jul 11, 1966  DATE OF ADMISSION:  02/03/2019   ADMITTING PHYSICIAN: Avel Peace Salary, MD  DATE OF DISCHARGE: No discharge date for patient encounter.  PRIMARY CARE PHYSICIAN: Dion Body, MD   ADMISSION DIAGNOSIS:  Tonsillar abscess [J36] DISCHARGE DIAGNOSIS:  Active Problems:   Peritonsillar abscess  SECONDARY DIAGNOSIS:   Past Medical History:  Diagnosis Date  . Allergy   . Anxiety    panic attacks  . Depressed affect   . Pulmonary emboli [facility])    HOSPITAL COURSE:   Chief complaint . Sore Throat  . Neck Pain    HISTORY OF PRESENT ILLNESS:  Lorraine Rose  is a 53 y.o. female  who presented with 1 week history of sore throat, seen in congenital clinic today and was thought to have peritonsillar abscess, sent to the ER for further evaluation, CT noted for left peritonsillar abscess.ENT was consulted.  Patient was admitted to medical service for further evaluation.  Please refer to the H&P dictated for further details.     Hospital course;  53 year old female with left tonsillar abscess, patient is on IV Rocephin, Decadron, seen by ENT Dr Dr. Jeannie Fend vaught, attempted peritonsillar abscess drainage during this admission , but not successful, because patient swelling improved from.  Patient was treated with IV antibiotics with Rocephin and Flagyl and IV Decadron per recommendations from ENT.  Diet has been advanced to soft consistency diet and patient tolerating well so far.  Patient remains afebrile.  Noted leukocytosis this morning which is due to ongoing steroid treatment. Dr. Vianne Bulls discussed case with ENT physician Dr. Pryor Ochoa who has given clearance to discharge patient since no plans for any surgical drainage at this time.  Recommended antibiotics for 2 weeks.  Discussed different antibiotics options with pharmacist especially with  patient being allergic to penicillin but patient tolerating Rocephin.  Decision was to discharge patient on p.o. cefdinir plus Flagyl for 14 days.  Prescription for p.o. steroid taper with prednisone for the next 6 days also given per recommendations from ENT. Patient with history of anxiety and panic attacks.  Stable.  Continue home dose of Paxil.  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  Stable.   Patient does have history of pulmonary embolism.  On anticoagulation with Eliquis which has been resumed per recommendation from ENT since no plans for drainage at this time.   Patient clinically and hemodynamically stable and wishes to be discharged home today as well.    DISCHARGE CONDITIONS:  Stable CONSULTS OBTAINED:   DRUG ALLERGIES:   Allergies  Allergen Reactions  . Penicillins Hives, Swelling and Other (See Comments)    Has patient had a PCN reaction causing immediate rash, facial/tongue/throat swelling, SOB or lightheadedness with hypotension: No Has patient had a PCN reaction causing severe rash involving mucus membranes or skin necrosis: No Has patient had a PCN reaction that required hospitalization No Has patient had a PCN reaction occurring within the last 10 years: No If all of the above answers are "NO", then may proceed with Cephalosporin use.  Other reaction(s): SWELLING   . Codeine Itching   DISCHARGE MEDICATIONS:   Allergies as of 02/05/2019      Reactions   Penicillins Hives, Swelling, Other (See Comments)   Has patient had a PCN reaction causing immediate rash, facial/tongue/throat swelling, SOB or lightheadedness with hypotension: No Has patient had a PCN  reaction causing severe rash involving mucus membranes or skin necrosis: No Has patient had a PCN reaction that required hospitalization No Has patient had a PCN reaction occurring within the last 10 years: No If all of the above answers are "NO", then may proceed with Cephalosporin use. Other reaction(s): SWELLING    Codeine Itching      Medication List    STOP taking these medications   azithromycin 250 MG tablet Commonly known as: ZITHROMAX   tiZANidine 4 MG tablet Commonly known as: Zanaflex     TAKE these medications   acetaminophen 500 MG tablet Commonly known as: TYLENOL Take 500 mg by mouth every 4 (four) hours as needed for mild pain, fever or headache.   albuterol 108 (90 Base) MCG/ACT inhaler Commonly known as: VENTOLIN HFA Inhale into the lungs.   apixaban 5 MG Tabs tablet Commonly known as: ELIQUIS Take 5 mg by mouth every 12 (twelve) hours.   cefdinir 300 MG capsule Commonly known as: OMNICEF Take 1 capsule (300 mg total) by mouth 2 (two) times daily for 14 days.   cyclobenzaprine 10 MG tablet Commonly known as: FLEXERIL Take 10 mg by mouth 3 (three) times daily as needed.   HYDROcodone-acetaminophen 5-325 MG tablet Commonly known as: Norco Take 1 tablet by mouth every 6 (six) hours as needed for moderate pain.   ibuprofen 200 MG tablet Commonly known as: ADVIL Take 800 mg by mouth every 6 (six) hours as needed for headache or mild pain.   metroNIDAZOLE 500 MG tablet Commonly known as: Flagyl Take 1 tablet (500 mg total) by mouth 3 (three) times daily for 14 days.   PARoxetine 30 MG tablet Commonly known as: PAXIL Take 30 mg by mouth at bedtime.   predniSONE 10 MG tablet Commonly known as: DELTASONE 60 mg p.o. daily x1 day Then 50 mg p.o. daily x1 day Then 40 mg p.o. daily x1 day Then 30 mg p.o. daily x1 day Then 20 mg p.o. daily x1 day Then 10 mg p.o. daily x1 day 6   pregabalin 50 MG capsule Commonly known as: Lyrica Take 1 capsule (50 mg total) by mouth 3 (three) times daily.   promethazine 12.5 MG tablet Commonly known as: PHENERGAN Take 12.5-25 mg by mouth every 6 (six) hours as needed for nausea/vomiting.   traZODone 100 MG tablet Commonly known as: DESYREL Take 200 mg by mouth at bedtime.        DISCHARGE INSTRUCTIONS:   DIET:  Soft  diet DISCHARGE CONDITION:  Stable ACTIVITY:  Activity as tolerated OXYGEN:  Home Oxygen: No.  Oxygen Delivery: room air DISCHARGE LOCATION:  home   If you experience worsening of your admission symptoms, develop shortness of breath, life threatening emergency, suicidal or homicidal thoughts you must seek medical attention immediately by calling 911 or calling your MD immediately  if symptoms less severe.  You Must read complete instructions/literature along with all the possible adverse reactions/side effects for all the Medicines you take and that have been prescribed to you. Take any new Medicines after you have completely understood and accpet all the possible adverse reactions/side effects.   Please note  You were cared for by a hospitalist during your hospital stay. If you have any questions about your discharge medications or the care you received while you were in the hospital after you are discharged, you can call the unit and asked to speak with the hospitalist on call if the hospitalist that took care of you is not  available. Once you are discharged, your primary care physician will handle any further medical issues. Please note that NO REFILLS for any discharge medications will be authorized once you are discharged, as it is imperative that you return to your primary care physician (or establish a relationship with a primary care physician if you do not have one) for your aftercare needs so that they can reassess your need for medications and monitor your lab values.    On the day of Discharge:  VITAL SIGNS:  Blood pressure 138/79, pulse (!) 54, temperature 97.7 F (36.5 C), resp. rate 16, height 5\' 6"  (1.676 m), weight 59 kg, last menstrual period 12/17/2018, SpO2 97 %. PHYSICAL EXAMINATION:  GENERAL:  53 y.o.-year-old patient lying in the bed with no acute distress.  EYES: Pupils equal, round, reactive to light and accommodation. No scleral icterus. Extraocular muscles intact.   HEENT: Head atraumatic, normocephalic. Oropharynx and nasopharynx clear.  NECK:  Supple, no jugular venous distention. No thyroid enlargement, no tenderness.  LUNGS: Normal breath sounds bilaterally, no wheezing, rales,rhonchi or crepitation. No use of accessory muscles of respiration.  CARDIOVASCULAR: S1, S2 normal. No murmurs, rubs, or gallops.  ABDOMEN: Soft, non-tender, non-distended. Bowel sounds present. No organomegaly or mass.  EXTREMITIES: No pedal edema, cyanosis, or clubbing.  NEUROLOGIC: Cranial nerves II through XII are intact. Muscle strength 5/5 in all extremities. Sensation intact. Gait not checked.  PSYCHIATRIC: The patient is alert and oriented x 3.  SKIN: No obvious rash, lesion, or ulcer.  DATA REVIEW:   CBC Recent Labs  Lab 02/05/19 0353  WBC 16.3*  HGB 13.2  HCT 39.6  PLT 336    Chemistries  Recent Labs  Lab 02/03/19 0938  NA 136  K 3.9  CL 103  CO2 23  GLUCOSE 120*  BUN 23*  CREATININE 0.47  CALCIUM 9.2  AST 16  ALT 20  ALKPHOS 87  BILITOT 0.4     Microbiology Results  Results for orders placed or performed during the hospital encounter of 02/03/19  SARS Coronavirus 2 (CEPHEID - Performed in [place] hospital lab), Hosp Order     Status: None   Collection Time: 02/03/19 11:52 AM   Specimen: Nasopharyngeal Swab  Result Value Ref Range Status   SARS Coronavirus 2 NEGATIVE NEGATIVE Final    Comment: (NOTE) If result is NEGATIVE SARS-CoV-2 target nucleic acids are NOT DETECTED. The SARS-CoV-2 RNA is generally detectable in upper and lower  respiratory specimens during the acute phase of infection. The lowest  concentration of SARS-CoV-2 viral copies this assay can detect is 250  copies / mL. A negative result does not preclude SARS-CoV-2 infection  and should not be used as the sole basis for treatment or other  patient management decisions.  A negative result may occur with  improper specimen collection / handling, submission of  specimen other  than nasopharyngeal swab, presence of viral mutation(s) within the  areas targeted by this assay, and inadequate number of viral copies  (<250 copies / mL). A negative result must be combined with clinical  observations, patient history, and epidemiological information. If result is POSITIVE SARS-CoV-2 target nucleic acids are DETECTED. The SARS-CoV-2 RNA is generally detectable in upper and lower  respiratory specimens dur ing the acute phase of infection.  Positive  results are indicative of active infection with SARS-CoV-2.  Clinical  correlation with patient history and other diagnostic information is  necessary to determine patient infection status.  Positive results do  not rule  out bacterial infection or co-infection with other viruses. If result is PRESUMPTIVE POSTIVE SARS-CoV-2 nucleic acids MAY BE PRESENT.   A presumptive positive result was obtained on the submitted specimen  and confirmed on repeat testing.  While 2019 novel coronavirus  (SARS-CoV-2) nucleic acids may be present in the submitted sample  additional confirmatory testing may be necessary for epidemiological  and / or clinical management purposes  to differentiate between  SARS-CoV-2 and other Sarbecovirus currently known to infect humans.  If clinically indicated additional testing with an alternate test  methodology [phone]) is advised. The SARS-CoV-2 RNA is generally  detectable in upper and lower respiratory sp ecimens during the acute  phase of infection. The expected result is Negative. Fact Sheet for Patients:  StrictlyIdeas.no Fact Sheet for Healthcare Providers: BankingDealers.co.za This test is not yet approved or cleared by the [place] FDA and has been authorized for detection and/or diagnosis of SARS-CoV-2 by FDA under an Emergency Use Authorization (EUA).  This EUA will remain in effect (meaning this test can be used) for the  duration of the COVID-19 declaration under Section 564(b)(1) of the Act, 21 U.S.C. section 360bbb-3(b)(1), unless the authorization is terminated or revoked sooner. Performed at [facility], [address] [place], [place] [address]     RADIOLOGY:  No results found.   Management plans discussed with the patient, family and they are in agreement.  CODE STATUS: Full Code   TOTAL TIME TAKING CARE OF THIS PATIENT: 38 minutes.    Kanijah Groseclose M.D on 02/05/2019 at 11:54 AM  Between 7am to 6pm - Pager - [phone]  After 6pm go to [email or web address] - Proofreader  [facility]  Office  [phone]  CC: Primary care physician; Dion Body, MD   Note: This dictation was prepared with Dragon dictation along with smaller phrase technology. Any transcriptional errors that result from this process are unintentional.

## 2019-02-05 NOTE — Plan of Care (Signed)
Pt is d/ced home.

## 2019-02-05 NOTE — Progress Notes (Signed)
Pt is d/ced home.  Reviewed d/c instructions, f/u appts, and new medications.  Pt will go home on PO abx and steroids.  Pt notes improvement in swelling on side of face, but she's still requiring a lot of pain medicine.  Pt currently going through stressful period with divorce and child custody battle. Encouraged her to really focus on taking care of herself.  Family will pick up pt.

## 2019-03-17 DIAGNOSIS — Z86711 Personal history of pulmonary embolism: Secondary | ICD-10-CM | POA: Diagnosis not present

## 2019-03-17 DIAGNOSIS — R531 Weakness: Secondary | ICD-10-CM | POA: Diagnosis not present

## 2019-04-12 DIAGNOSIS — F330 Major depressive disorder, recurrent, mild: Secondary | ICD-10-CM | POA: Diagnosis not present

## 2019-08-22 ENCOUNTER — Ambulatory Visit
Admission: RE | Admit: 2019-08-22 | Discharge: 2019-08-22 | Disposition: A | Discharge status: Home / Self Care | Payer: Medicare HMO | Source: Ambulatory Visit | Attending: Family Medicine | Admitting: Family Medicine

## 2019-08-22 ENCOUNTER — Other Ambulatory Visit: Payer: Self-pay

## 2019-08-22 ENCOUNTER — Other Ambulatory Visit: Payer: Self-pay | Admitting: Family Medicine

## 2019-08-22 ENCOUNTER — Other Ambulatory Visit
Admission: RE | Admit: 2019-08-22 | Discharge: 2019-08-22 | Disposition: A | Discharge status: Home / Self Care | Payer: Medicare HMO | Source: Ambulatory Visit | Attending: Family Medicine | Admitting: Family Medicine

## 2019-08-22 DIAGNOSIS — F411 Generalized anxiety disorder: Secondary | ICD-10-CM | POA: Diagnosis not present

## 2019-08-22 DIAGNOSIS — R002 Palpitations: Secondary | ICD-10-CM | POA: Diagnosis not present

## 2019-08-22 DIAGNOSIS — R0602 Shortness of breath: Secondary | ICD-10-CM | POA: Diagnosis not present

## 2019-08-22 DIAGNOSIS — Z87891 Personal history of nicotine dependence: Secondary | ICD-10-CM | POA: Diagnosis not present

## 2019-08-22 DIAGNOSIS — R071 Chest pain on breathing: Secondary | ICD-10-CM | POA: Insufficient documentation

## 2019-08-22 DIAGNOSIS — R7989 Other specified abnormal findings of blood chemistry: Secondary | ICD-10-CM | POA: Diagnosis not present

## 2019-08-22 LAB — FIBRIN DERIVATIVES D-DIMER (ARMC ONLY): Fibrin derivatives D-dimer (ARMC): 819.82 ng/mL (FEU) — ABNORMAL HIGH (ref 0.00–499.00)

## 2019-08-22 MED ORDER — IOHEXOL 350 MG/ML SOLN
75.0000 mL | Freq: Once | INTRAVENOUS | Status: AC | PRN
  Administered 2019-08-22: 14:00:00 75 mL via INTRAVENOUS

## 2019-08-23 DIAGNOSIS — H524 Presbyopia: Secondary | ICD-10-CM | POA: Diagnosis not present

## 2019-08-23 DIAGNOSIS — H5213 Myopia, bilateral: Secondary | ICD-10-CM | POA: Diagnosis not present

## 2019-08-26 DIAGNOSIS — Z23 Encounter for immunization: Secondary | ICD-10-CM | POA: Diagnosis not present

## 2019-08-26 DIAGNOSIS — M5416 Radiculopathy, lumbar region: Secondary | ICD-10-CM | POA: Diagnosis not present

## 2019-08-26 DIAGNOSIS — Z86711 Personal history of pulmonary embolism: Secondary | ICD-10-CM | POA: Diagnosis not present

## 2019-08-26 DIAGNOSIS — Z87891 Personal history of nicotine dependence: Secondary | ICD-10-CM | POA: Diagnosis not present

## 2019-08-26 DIAGNOSIS — F411 Generalized anxiety disorder: Secondary | ICD-10-CM | POA: Diagnosis not present

## 2019-08-26 DIAGNOSIS — M5412 Radiculopathy, cervical region: Secondary | ICD-10-CM | POA: Diagnosis not present

## 2019-09-03 DIAGNOSIS — H5203 Hypermetropia, bilateral: Secondary | ICD-10-CM | POA: Diagnosis not present

## 2019-09-08 DIAGNOSIS — M5431 Sciatica, right side: Secondary | ICD-10-CM | POA: Diagnosis not present

## 2019-09-08 DIAGNOSIS — M25551 Pain in right hip: Secondary | ICD-10-CM | POA: Diagnosis not present

## 2019-09-12 DIAGNOSIS — Z87891 Personal history of nicotine dependence: Secondary | ICD-10-CM | POA: Diagnosis not present

## 2019-09-12 DIAGNOSIS — M5416 Radiculopathy, lumbar region: Secondary | ICD-10-CM | POA: Diagnosis not present

## 2019-09-12 DIAGNOSIS — M5441 Lumbago with sciatica, right side: Secondary | ICD-10-CM | POA: Diagnosis not present

## 2019-09-12 DIAGNOSIS — M5431 Sciatica, right side: Secondary | ICD-10-CM | POA: Diagnosis not present

## 2019-09-26 DIAGNOSIS — M5441 Lumbago with sciatica, right side: Secondary | ICD-10-CM | POA: Diagnosis not present

## 2019-09-26 DIAGNOSIS — S32000S Wedge compression fracture of unspecified lumbar vertebra, sequela: Secondary | ICD-10-CM | POA: Diagnosis not present

## 2019-09-26 DIAGNOSIS — Z981 Arthrodesis status: Secondary | ICD-10-CM | POA: Diagnosis not present

## 2019-09-27 ENCOUNTER — Other Ambulatory Visit: Payer: Self-pay | Admitting: Physician Assistant

## 2019-09-27 DIAGNOSIS — Z981 Arthrodesis status: Secondary | ICD-10-CM

## 2019-09-27 DIAGNOSIS — M5441 Lumbago with sciatica, right side: Secondary | ICD-10-CM

## 2019-09-27 DIAGNOSIS — S32000S Wedge compression fracture of unspecified lumbar vertebra, sequela: Secondary | ICD-10-CM

## 2019-10-07 ENCOUNTER — Ambulatory Visit
Admission: RE | Admit: 2019-10-07 | Discharge: 2019-10-07 | Disposition: A | Discharge status: Home / Self Care | Payer: Medicare HMO | Source: Ambulatory Visit | Attending: Physician Assistant | Admitting: Physician Assistant

## 2019-10-07 ENCOUNTER — Other Ambulatory Visit: Payer: Self-pay

## 2019-10-07 DIAGNOSIS — Z981 Arthrodesis status: Secondary | ICD-10-CM

## 2019-10-07 DIAGNOSIS — S32000S Wedge compression fracture of unspecified lumbar vertebra, sequela: Secondary | ICD-10-CM

## 2019-10-07 DIAGNOSIS — M5441 Lumbago with sciatica, right side: Secondary | ICD-10-CM

## 2019-10-07 DIAGNOSIS — M545 Low back pain: Secondary | ICD-10-CM | POA: Diagnosis not present

## 2019-10-12 DIAGNOSIS — M5417 Radiculopathy, lumbosacral region: Secondary | ICD-10-CM | POA: Diagnosis not present

## 2019-10-12 DIAGNOSIS — Z79899 Other long term (current) drug therapy: Secondary | ICD-10-CM | POA: Diagnosis not present

## 2019-10-20 DIAGNOSIS — R300 Dysuria: Secondary | ICD-10-CM | POA: Diagnosis not present

## 2019-10-20 DIAGNOSIS — R319 Hematuria, unspecified: Secondary | ICD-10-CM | POA: Diagnosis not present

## 2019-10-20 DIAGNOSIS — N390 Urinary tract infection, site not specified: Secondary | ICD-10-CM | POA: Diagnosis not present

## 2019-11-07 DIAGNOSIS — Z981 Arthrodesis status: Secondary | ICD-10-CM | POA: Diagnosis not present

## 2019-11-07 DIAGNOSIS — M79604 Pain in right leg: Secondary | ICD-10-CM | POA: Diagnosis not present

## 2019-11-07 DIAGNOSIS — M25551 Pain in right hip: Secondary | ICD-10-CM | POA: Diagnosis not present

## 2019-11-07 DIAGNOSIS — M5441 Lumbago with sciatica, right side: Secondary | ICD-10-CM | POA: Diagnosis not present

## 2019-11-22 ENCOUNTER — Other Ambulatory Visit: Payer: Self-pay | Admitting: Family Medicine

## 2019-11-22 DIAGNOSIS — N6320 Unspecified lump in the left breast, unspecified quadrant: Secondary | ICD-10-CM | POA: Diagnosis not present

## 2019-12-01 ENCOUNTER — Other Ambulatory Visit: Payer: Medicare HMO

## 2019-12-09 ENCOUNTER — Ambulatory Visit
Admission: RE | Admit: 2019-12-09 | Discharge: 2019-12-09 | Disposition: A | Discharge status: Home / Self Care | Payer: Medicare HMO | Source: Ambulatory Visit | Attending: Family Medicine | Admitting: Family Medicine

## 2019-12-09 DIAGNOSIS — N6012 Diffuse cystic mastopathy of left breast: Secondary | ICD-10-CM | POA: Diagnosis not present

## 2019-12-09 DIAGNOSIS — N6489 Other specified disorders of breast: Secondary | ICD-10-CM | POA: Diagnosis not present

## 2019-12-09 DIAGNOSIS — N6320 Unspecified lump in the left breast, unspecified quadrant: Secondary | ICD-10-CM

## 2019-12-09 DIAGNOSIS — R921 Mammographic calcification found on diagnostic imaging of breast: Secondary | ICD-10-CM | POA: Diagnosis not present

## 2019-12-09 DIAGNOSIS — N6324 Unspecified lump in the left breast, lower inner quadrant: Secondary | ICD-10-CM | POA: Diagnosis not present

## 2019-12-12 ENCOUNTER — Other Ambulatory Visit: Payer: Self-pay | Admitting: Family Medicine

## 2019-12-12 DIAGNOSIS — R928 Other abnormal and inconclusive findings on diagnostic imaging of breast: Secondary | ICD-10-CM

## 2019-12-19 ENCOUNTER — Ambulatory Visit
Admission: RE | Admit: 2019-12-19 | Discharge: 2019-12-19 | Disposition: A | Discharge status: Home / Self Care | Payer: Medicare HMO | Source: Ambulatory Visit | Attending: Family Medicine | Admitting: Family Medicine

## 2019-12-19 DIAGNOSIS — R928 Other abnormal and inconclusive findings on diagnostic imaging of breast: Secondary | ICD-10-CM | POA: Insufficient documentation

## 2019-12-19 DIAGNOSIS — R921 Mammographic calcification found on diagnostic imaging of breast: Secondary | ICD-10-CM | POA: Diagnosis not present

## 2019-12-19 DIAGNOSIS — N6082 Other benign mammary dysplasias of left breast: Secondary | ICD-10-CM | POA: Diagnosis not present

## 2019-12-19 HISTORY — PX: BREAST BIOPSY: SHX20

## 2019-12-20 LAB — SURGICAL PATHOLOGY

## 2020-04-21 IMAGING — MR MR LUMBAR SPINE W/O CM
5 series · 35 of 48 positions shown · non-contrast
Comparison: MRI 07/19/2014

CLINICAL DATA: Low back pain radiating to the left leg with
numbness all the way to the foot and toes.

EXAM:
MRI LUMBAR SPINE WITHOUT CONTRAST
TECHNIQUE: Multiplanar, multisequence MR imaging of the lumbar spine was
performed. No intravenous contrast was administered.

[Series 2: T2 · sagittal · 4.0mm · 0.81mm/px · 6 of 17 slices shown (1 of 2)]
[im 1/17]
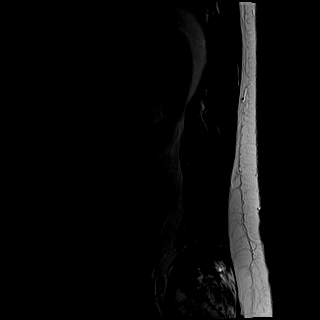
[im 4/17]
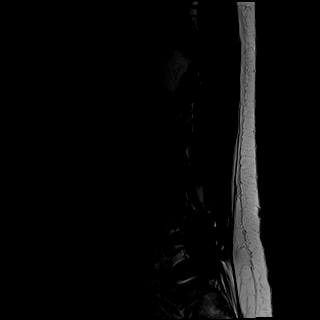
[im 7/17]
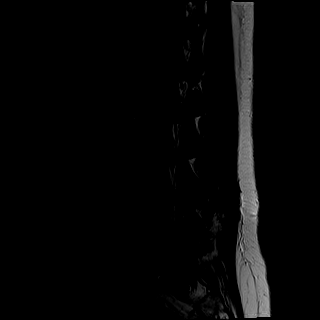
[im 10/17]
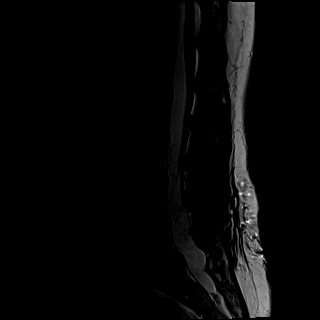
[im 13/17]
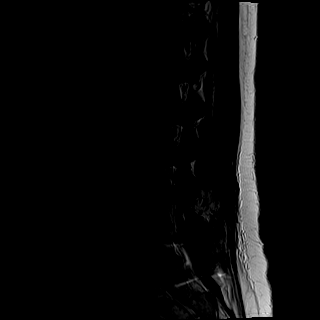
[im 17/17]
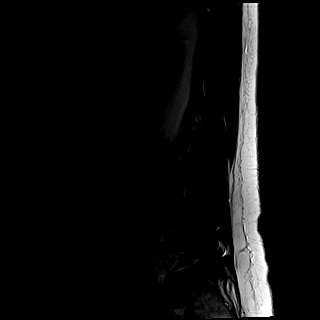

[Series 3: T1 · sagittal · 4.0mm · 0.81mm/px · 6 of 17 slices shown (1 of 2)]
[im 1/17]
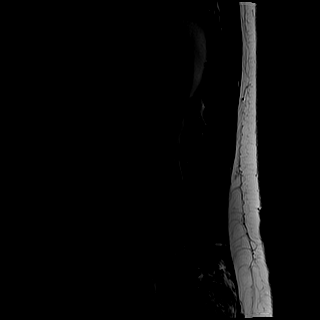
[im 4/17]
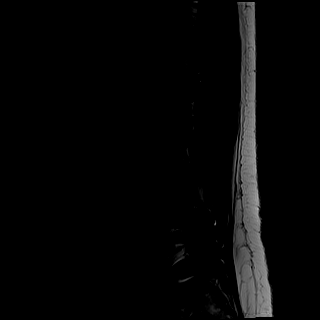
[im 7/17]
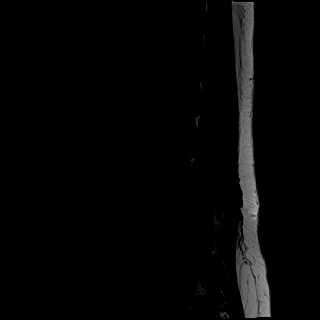
[im 10/17]
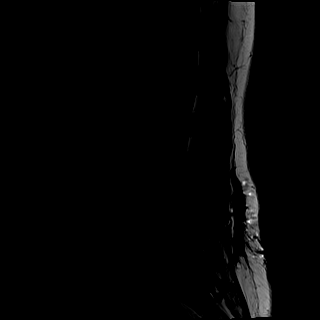
[im 13/17]
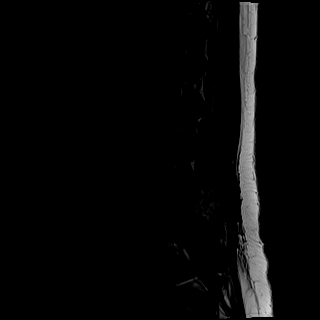
[im 17/17]
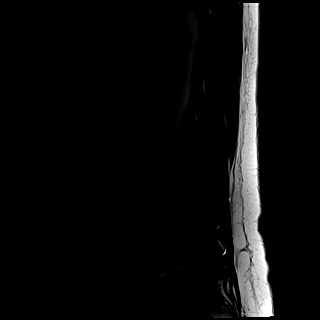

[Series 4: STIR · sagittal · 4.0mm · 0.81mm/px · 5 of 17 slices shown]
[im 1/17]
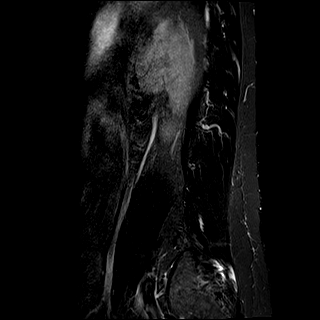
[im 4/17]
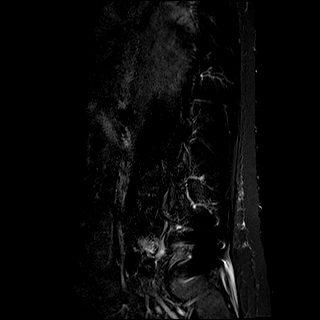
[im 7/17]
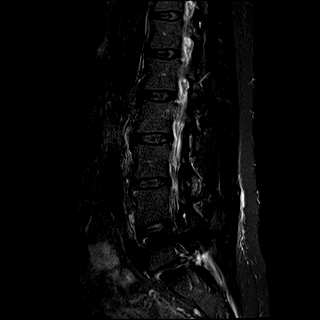
[im 10/17]
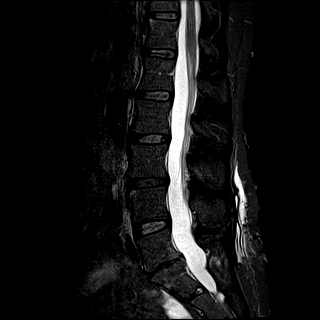
[im 13/17]
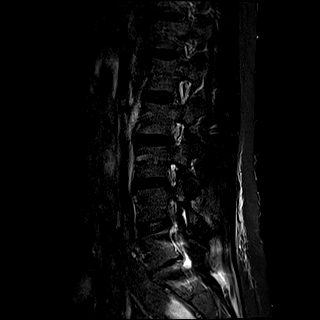

[Series 5: T2 · axial · 4.0mm · 0.78mm/px · z∈[-95,+113]mm · 9 of 40 slices shown (2 of 2)]
[im 1/40]
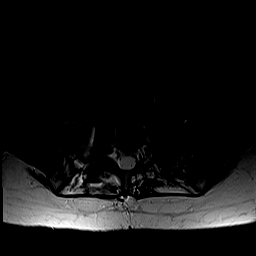
[im 6/40]
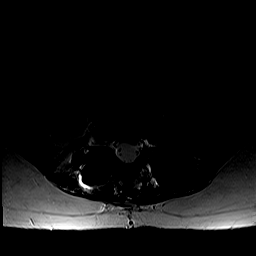
[im 12/40]
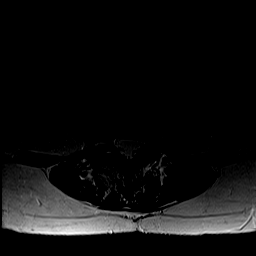
[im 17/40]
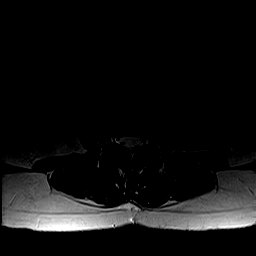
[im 20/40]
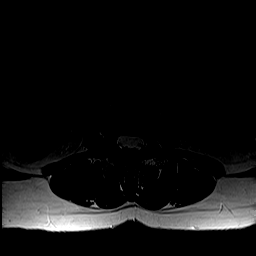
[im 23/40]
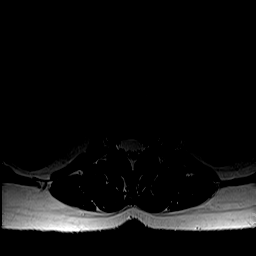
[im 28/40]
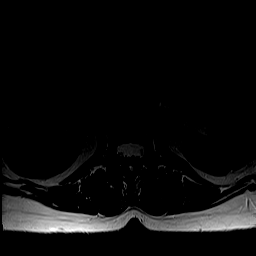
[im 34/40]
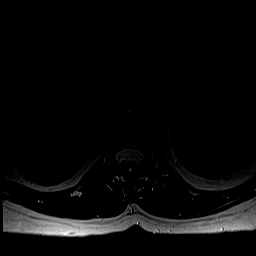
[im 40/40]
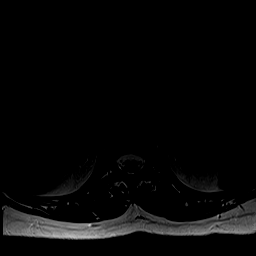

[Series 6: T1 · axial · 4.0mm · 0.39mm/px · z∈[-95,+113]mm · 9 of 40 slices shown (2 of 2)]
[im 1/40]
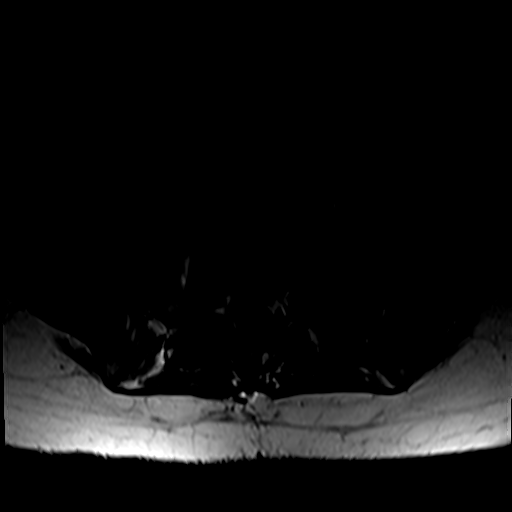
[im 6/40]
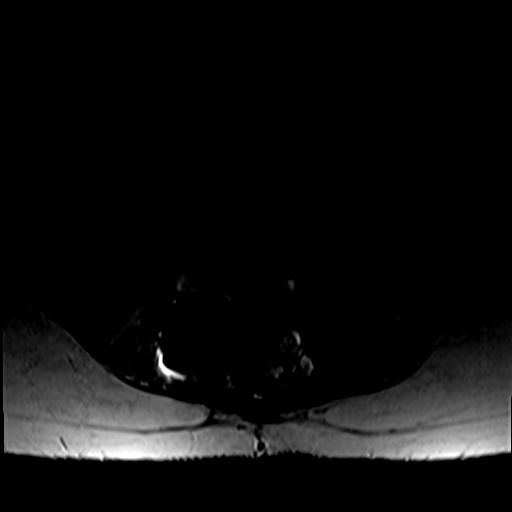
[im 12/40]
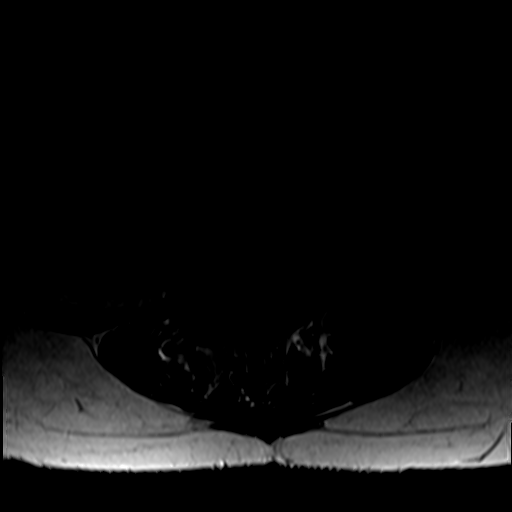
[im 17/40]
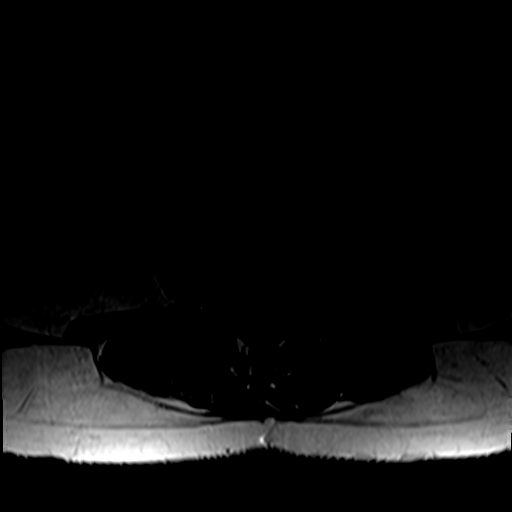
[im 20/40]
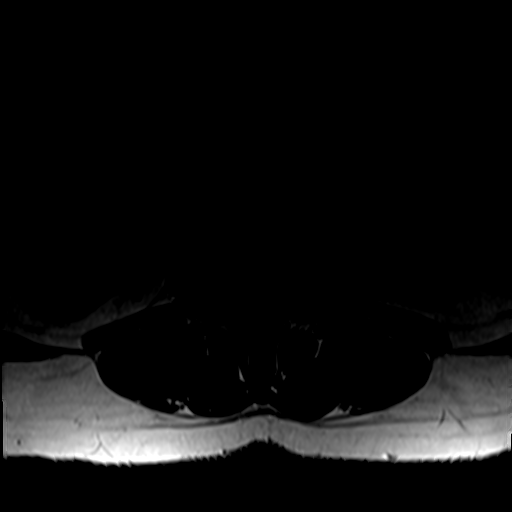
[im 23/40]
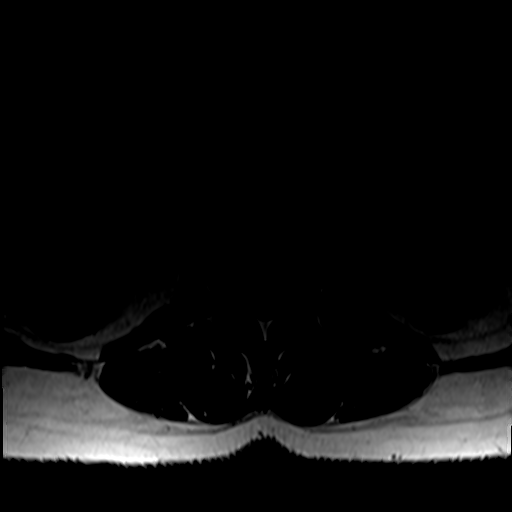
[im 28/40]
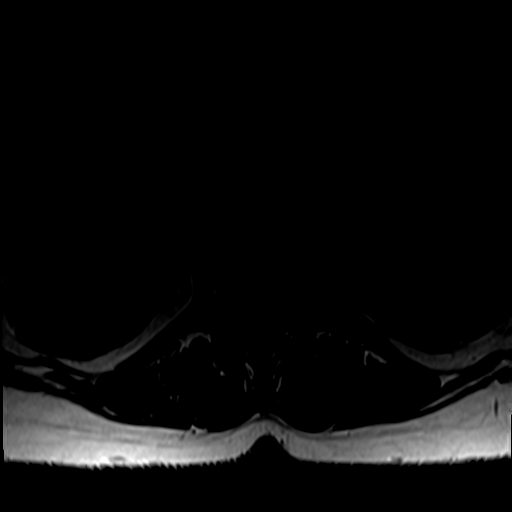
[im 34/40]
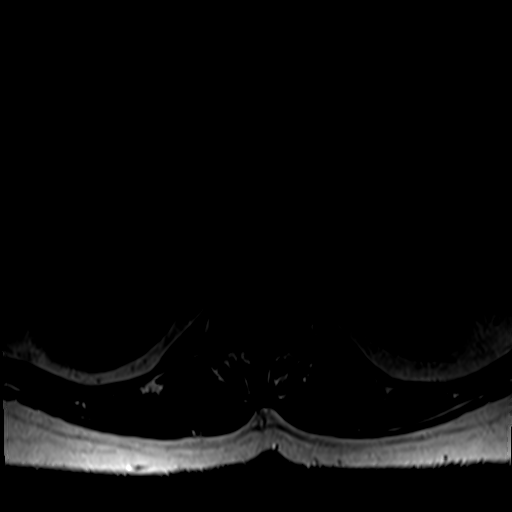
[im 40/40]
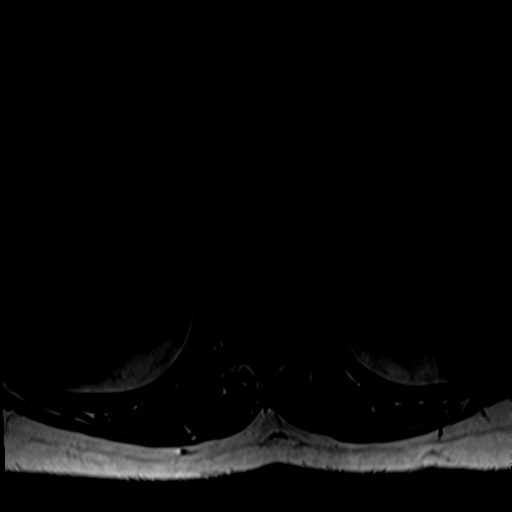

[35 of 48 positions shown; findings below may reference images not displayed]

FINDINGS: Segmentation:  5 lumbar type vertebral bodies.

Alignment:  Normal

Vertebrae:  No fracture or primary bone lesion.

Conus medullaris and cauda equina: Conus extends to the L1 level.
Conus and cauda equina appear normal.

Paraspinal and other soft tissues: Small simple renal cyst on the
left.

Disc levels:

No abnormality at T11-12, T12-L1 or L1-2.

L2-3: Minimal bulging of the disc. No stenosis of the canal or
foramina.

L3-4: Mild bulging of the disc. No stenosis of the canal or
foramina. Mild facet hypertrophy without edema.

L4-5: Minimal bulging of the disc. Mild facet hypertrophy without
edema. No stenosis.

L5-S1: Previous posterior decompression, diskectomy and fusion.
Bilateral pedicle screws and posterior rods. As seen previously,
there are some probable osteophytes to the left of center at the
disc level which would have some potential 2 cause irritation of the
left S1 nerve. Certainly these do not frankly compress the nerve.
The appearance is similar to the study of 6279.
IMPRESSION: Previous PLIF L5-S1 appears the same. To the left of center at the
disc level, there appear to be some small osteophytes that would
have some potential to irritate the left S1 nerve, though nerve
compression is certainly not present.

Mild, non-compressive disc bulges at L2-3, L3-4 and L5-S1. Mild
facet hypertrophy at L3-4 and L4-5 but without visible edema or
encroachment upon the neural spaces.

## 2020-06-10 ENCOUNTER — Observation Stay (HOSPITAL_COMMUNITY)
Admission: EM | Admit: 2020-06-10 | Discharge: 2020-06-11 | Disposition: A | Discharge status: Home / Self Care | Payer: Medicare HMO | Attending: Family Medicine | Admitting: Family Medicine

## 2020-06-10 ENCOUNTER — Emergency Department (HOSPITAL_COMMUNITY): Payer: Medicare HMO

## 2020-06-10 ENCOUNTER — Encounter (HOSPITAL_COMMUNITY): Payer: Self-pay | Admitting: Internal Medicine

## 2020-06-10 ENCOUNTER — Observation Stay (HOSPITAL_COMMUNITY): Payer: Medicare HMO

## 2020-06-10 DIAGNOSIS — Z20822 Contact with and (suspected) exposure to covid-19: Secondary | ICD-10-CM | POA: Insufficient documentation

## 2020-06-10 DIAGNOSIS — S060X9A Concussion with loss of consciousness of unspecified duration, initial encounter: Secondary | ICD-10-CM | POA: Diagnosis present

## 2020-06-10 DIAGNOSIS — K760 Fatty (change of) liver, not elsewhere classified: Secondary | ICD-10-CM | POA: Insufficient documentation

## 2020-06-10 DIAGNOSIS — R52 Pain, unspecified: Secondary | ICD-10-CM | POA: Diagnosis not present

## 2020-06-10 DIAGNOSIS — Z7901 Long term (current) use of anticoagulants: Secondary | ICD-10-CM | POA: Diagnosis not present

## 2020-06-10 DIAGNOSIS — E876 Hypokalemia: Secondary | ICD-10-CM | POA: Insufficient documentation

## 2020-06-10 DIAGNOSIS — W19XXXA Unspecified fall, initial encounter: Secondary | ICD-10-CM

## 2020-06-10 DIAGNOSIS — M79643 Pain in unspecified hand: Secondary | ICD-10-CM | POA: Diagnosis not present

## 2020-06-10 DIAGNOSIS — Y92009 Unspecified place in unspecified non-institutional (private) residence as the place of occurrence of the external cause: Secondary | ICD-10-CM

## 2020-06-10 DIAGNOSIS — S060XAA Concussion with loss of consciousness status unknown, initial encounter: Secondary | ICD-10-CM | POA: Diagnosis present

## 2020-06-10 DIAGNOSIS — W109XXA Fall (on) (from) unspecified stairs and steps, initial encounter: Secondary | ICD-10-CM | POA: Diagnosis not present

## 2020-06-10 DIAGNOSIS — D259 Leiomyoma of uterus, unspecified: Secondary | ICD-10-CM | POA: Insufficient documentation

## 2020-06-10 DIAGNOSIS — F17210 Nicotine dependence, cigarettes, uncomplicated: Secondary | ICD-10-CM | POA: Diagnosis not present

## 2020-06-10 DIAGNOSIS — R609 Edema, unspecified: Secondary | ICD-10-CM | POA: Diagnosis not present

## 2020-06-10 DIAGNOSIS — M6282 Rhabdomyolysis: Principal | ICD-10-CM

## 2020-06-10 DIAGNOSIS — T68XXXA Hypothermia, initial encounter: Secondary | ICD-10-CM | POA: Diagnosis not present

## 2020-06-10 DIAGNOSIS — S60812A Abrasion of left wrist, initial encounter: Secondary | ICD-10-CM | POA: Diagnosis not present

## 2020-06-10 DIAGNOSIS — R079 Chest pain, unspecified: Secondary | ICD-10-CM | POA: Diagnosis not present

## 2020-06-10 DIAGNOSIS — Z043 Encounter for examination and observation following other accident: Secondary | ICD-10-CM | POA: Diagnosis not present

## 2020-06-10 DIAGNOSIS — R0789 Other chest pain: Secondary | ICD-10-CM | POA: Diagnosis present

## 2020-06-10 DIAGNOSIS — S060X0A Concussion without loss of consciousness, initial encounter: Secondary | ICD-10-CM | POA: Diagnosis not present

## 2020-06-10 DIAGNOSIS — R0902 Hypoxemia: Secondary | ICD-10-CM | POA: Diagnosis not present

## 2020-06-10 DIAGNOSIS — Z79899 Other long term (current) drug therapy: Secondary | ICD-10-CM | POA: Insufficient documentation

## 2020-06-10 DIAGNOSIS — S3991XA Unspecified injury of abdomen, initial encounter: Secondary | ICD-10-CM | POA: Diagnosis not present

## 2020-06-10 LAB — PROTIME-INR
INR: 1.1 (ref 0.8–1.2)
Prothrombin Time: 13.6 seconds (ref 11.4–15.2)

## 2020-06-10 LAB — COMPREHENSIVE METABOLIC PANEL
ALT: 39 U/L (ref 0–44)
AST: 72 U/L — ABNORMAL HIGH (ref 15–41)
Albumin: 3.3 g/dL — ABNORMAL LOW (ref 3.5–5.0)
Alkaline Phosphatase: 101 U/L (ref 38–126)
Anion gap: 14 (ref 5–15)
BUN: 13 mg/dL (ref 6–20)
CO2: 26 mmol/L (ref 22–32)
Calcium: 9.4 mg/dL (ref 8.9–10.3)
Chloride: 100 mmol/L (ref 98–111)
Creatinine, Ser: 0.65 mg/dL (ref 0.44–1.00)
GFR, Estimated: >60 mL/min (ref >60)
Glucose, Bld: 103 mg/dL — ABNORMAL HIGH (ref 70–99)
Potassium: 3.3 mmol/L — ABNORMAL LOW (ref 3.5–5.1)
Sodium: 140 mmol/L (ref 135–145)
Total Bilirubin: 1 mg/dL (ref 0.3–1.2)
Total Protein: 6.4 g/dL — ABNORMAL LOW (ref 6.5–8.1)

## 2020-06-10 LAB — CBC
HCT: 44.3 % (ref 36.0–46.0)
Hemoglobin: 14.3 g/dL (ref 12.0–15.0)
MCH: 29.3 pg (ref 26.0–34.0)
MCHC: 32.3 g/dL (ref 30.0–36.0)
MCV: 90.8 fL (ref 80.0–100.0)
Platelets: 307 10*3/uL (ref 150–400)
RBC: 4.88 MIL/uL (ref 3.87–5.11)
RDW: 14.3 % (ref 11.5–15.5)
WBC: 10.7 10*3/uL — ABNORMAL HIGH (ref 4.0–10.5)
nRBC: 0 % (ref 0.0–0.2)

## 2020-06-10 LAB — RAPID URINE DRUG SCREEN, HOSP PERFORMED
Amphetamines: NOT DETECTED
Barbiturates: NOT DETECTED
Benzodiazepines: POSITIVE — AB
Cocaine: NOT DETECTED
Opiates: POSITIVE — AB
Tetrahydrocannabinol: NOT DETECTED

## 2020-06-10 LAB — URINALYSIS, ROUTINE W REFLEX MICROSCOPIC
Bilirubin Urine: NEGATIVE
Glucose, UA: NEGATIVE mg/dL
Ketones, ur: 5 mg/dL — AB
Nitrite: POSITIVE — AB
Protein, ur: 30 mg/dL — AB
Specific Gravity, Urine: >1.046 — ABNORMAL HIGH (ref 1.005–1.030)
pH: 8 (ref 5.0–8.0)

## 2020-06-10 LAB — HEMOGLOBIN A1C
Hgb A1c MFr Bld: 5.8 % — ABNORMAL HIGH (ref 4.8–5.6)
Mean Plasma Glucose: 119.76 mg/dL

## 2020-06-10 LAB — I-STAT VENOUS BLOOD GAS, ED
Acid-Base Excess: 8 mmol/L — ABNORMAL HIGH (ref 0.0–2.0)
Bicarbonate: 32.2 mmol/L — ABNORMAL HIGH (ref 20.0–28.0)
Calcium, Ion: 1.11 mmol/L — ABNORMAL LOW (ref 1.15–1.40)
HCT: 40 % (ref 36.0–46.0)
Hemoglobin: 13.6 g/dL (ref 12.0–15.0)
O2 Saturation: 80 %
Potassium: 2.8 mmol/L — ABNORMAL LOW (ref 3.5–5.1)
Sodium: 142 mmol/L (ref 135–145)
TCO2: 33 mmol/L — ABNORMAL HIGH (ref 22–32)
pCO2, Ven: 44.1 mmHg (ref 44.0–60.0)
pH, Ven: 7.471 — ABNORMAL HIGH (ref 7.250–7.430)
pO2, Ven: 42 mmHg (ref 32.0–45.0)

## 2020-06-10 LAB — CK
Total CK: 6898 U/L — ABNORMAL HIGH (ref 38–234)
Total CK: 4378 U/L — ABNORMAL HIGH (ref 38–234)

## 2020-06-10 LAB — RESP PANEL BY RT PCR (RSV, FLU A&B, COVID)
Influenza A by PCR: NEGATIVE
Influenza B by PCR: NEGATIVE
Respiratory Syncytial Virus by PCR: NEGATIVE
SARS Coronavirus 2 by RT PCR: NEGATIVE

## 2020-06-10 LAB — I-STAT CHEM 8, ED
BUN: 15 mg/dL (ref 6–20)
Calcium, Ion: 1.18 mmol/L (ref 1.15–1.40)
Chloride: 98 mmol/L (ref 98–111)
Creatinine, Ser: 0.5 mg/dL (ref 0.44–1.00)
Glucose, Bld: 98 mg/dL (ref 70–99)
HCT: 45 % (ref 36.0–46.0)
Hemoglobin: 15.3 g/dL — ABNORMAL HIGH (ref 12.0–15.0)
Potassium: 3.2 mmol/L — ABNORMAL LOW (ref 3.5–5.1)
Sodium: 141 mmol/L (ref 135–145)
TCO2: 28 mmol/L (ref 22–32)

## 2020-06-10 LAB — HIV ANTIBODY (ROUTINE TESTING W REFLEX): HIV Screen 4th Generation wRfx: NONREACTIVE

## 2020-06-10 LAB — TSH: TSH: 0.295 u[IU]/mL — ABNORMAL LOW (ref 0.350–4.500)

## 2020-06-10 LAB — MAGNESIUM: Magnesium: 1.6 mg/dL — ABNORMAL LOW (ref 1.7–2.4)

## 2020-06-10 LAB — ETHANOL: Alcohol, Ethyl (B): <10 mg/dL (ref <10)

## 2020-06-10 LAB — SAMPLE TO BLOOD BANK

## 2020-06-10 LAB — LACTIC ACID, PLASMA: Lactic Acid, Venous: 3.4 mmol/L (ref 0.5–1.9)

## 2020-06-10 MED ORDER — SODIUM CHLORIDE 0.9 % IV SOLN: INTRAVENOUS | Status: DC

## 2020-06-10 MED ORDER — IOHEXOL 300 MG/ML  SOLN
100.0000 mL | Freq: Once | INTRAMUSCULAR | Status: AC | PRN
  Administered 2020-06-10: 100 mL via INTRAVENOUS

## 2020-06-10 MED ORDER — ALBUTEROL SULFATE (2.5 MG/3ML) 0.083% IN NEBU
2.5000 mg | INHALATION_SOLUTION | Freq: Four times a day (QID) | RESPIRATORY_TRACT | Status: DC
  Administered 2020-06-10 – 2020-06-11 (×3): 2.5 mg via RESPIRATORY_TRACT
  Filled 2020-06-10 (×4): qty 3

## 2020-06-10 MED ORDER — ONDANSETRON HCL 4 MG/2ML IJ SOLN: 4.0000 mg | Freq: Four times a day (QID) | INTRAMUSCULAR | Status: DC | PRN

## 2020-06-10 MED ORDER — SODIUM CHLORIDE 0.9% FLUSH
3.0000 mL | Freq: Two times a day (BID) | INTRAVENOUS | Status: DC
  Administered 2020-06-10 – 2020-06-11 (×3): 3 mL via INTRAVENOUS

## 2020-06-10 MED ORDER — SODIUM CHLORIDE 0.9 % IV BOLUS
1000.0000 mL | Freq: Once | INTRAVENOUS | Status: AC
  Administered 2020-06-10: 1000 mL via INTRAVENOUS

## 2020-06-10 MED ORDER — DOCUSATE SODIUM 100 MG PO CAPS
100.0000 mg | ORAL_CAPSULE | Freq: Two times a day (BID) | ORAL | Status: DC
  Administered 2020-06-10: 100 mg via ORAL
  Filled 2020-06-10: qty 1

## 2020-06-10 MED ORDER — IBUPROFEN 200 MG PO TABS
400.0000 mg | ORAL_TABLET | Freq: Four times a day (QID) | ORAL | Status: DC | PRN
  Filled 2020-06-10: qty 2

## 2020-06-10 MED ORDER — ONDANSETRON HCL 4 MG PO TABS: 4.0000 mg | ORAL_TABLET | Freq: Four times a day (QID) | ORAL | Status: DC | PRN

## 2020-06-10 MED ORDER — SENNOSIDES-DOCUSATE SODIUM 8.6-50 MG PO TABS: 1.0000 | ORAL_TABLET | Freq: Every evening | ORAL | Status: DC | PRN

## 2020-06-10 MED ORDER — CIPROFLOXACIN IN D5W 400 MG/200ML IV SOLN
400.0000 mg | Freq: Two times a day (BID) | INTRAVENOUS | Status: DC
  Administered 2020-06-10: 400 mg via INTRAVENOUS
  Filled 2020-06-10 (×3): qty 200

## 2020-06-10 MED ORDER — ENOXAPARIN SODIUM 40 MG/0.4ML ~~LOC~~ SOLN
40.0000 mg | SUBCUTANEOUS | Status: DC
  Administered 2020-06-10: 40 mg via SUBCUTANEOUS
  Filled 2020-06-10: qty 0.4

## 2020-06-10 NOTE — ED Triage Notes (Signed)
Patient BIB EMS from scene. She was seen lying at the foot of about 10 steps and her things were on the 3rd step. Witnesses say they saw her before 5 am. 911 alerted just before 9 am. Patient lac to top lip and abrasion to left hand. Patient drowsy on assessment, but oriented x 4.

## 2020-06-10 NOTE — ED Provider Notes (Signed)
[facility] EMERGENCY DEPARTMENT Provider Note   CSN: [id number] Arrival date & time: 06/10/20  0900     History Chief Complaint  Patient presents with  . Alcohol Intoxication  . Fall    Lorraine Rose is a 54 y.o. female.  HPI   54 year old female with a history of allergies, anxiety, depressed affect, pulmonary emboli (on Eliquis,), who presents to the emergency department today via EMS for evaluation of a fall.  EMS states that suspect patient fell down 2 flights of stairs outside.  Her things were noted at the top of 2 flights of stairs and she was laying at the bottom of them.  Reportedly, she was noted to be laying there at least since 5 AM but it is unclear how long she had been laying there.  In route, CBG within normal limits at 123, BP 124/66, heart rate 80, 94% on room air.  Patient states she does not remember the fall.  States that all she remembers is feeling dizzy.  States she has had 1-2 beers tonight.  She denies any drug use.  She does report compliance with her anticoagulation.  She chest wall pain, abrasions to left wrist.  Has some pain to the left posterior thigh and upper back.  Past Medical History:  Diagnosis Date  . Allergy   . Anxiety    panic attacks  . Depressed affect   . Pulmonary emboli [facility])     Patient Active Problem List   Diagnosis Date Noted  . Peritonsillar abscess 02/03/2019  . Pulmonary embolism ([place]) 01/28/2018  . Anaphylaxis 05/04/2016  . Anxiety 05/04/2016  . Depression 05/04/2016  . DDD (degenerative disc disease), lumbar 10/23/2015  . Status post lumbar laminectomy 10/23/2015  . Status post lumbar spinal fusion 10/23/2015  . Facet syndrome, lumbar 10/23/2015  . Lumbar radiculopathy 10/23/2015  . 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 Depressed affect     Past Surgical History:  Procedure Laterality Date  . BACK SURGERY     2004, 2006  . BREAST BIOPSY Left 12/19/2019   Affirm bx-"ribbon"  clip-path pending  . CESAREAN SECTION    . CHOLECYSTECTOMY    . NASAL SINUS SURGERY       OB History   No obstetric history on file.     Family History  Problem Relation Age of Onset  . Heart disease Mother   . Alzheimer's disease Father     Social History   Tobacco Use  . Smoking status: Current Some Day Smoker    Packs/day: 0.20    Types: Cigarettes  . Smokeless tobacco: Never Used  Vaping Use  . Vaping Use: Never used  Substance Use Topics  . Alcohol use: No    Alcohol/week: 0.0 standard drinks  . Drug use: No    Comment: h/o opioid abuse    Home Medications Prior to Admission medications   Medication Sig Start Date End Date Taking? Authorizing Provider  acetaminophen (TYLENOL) 500 MG tablet Take 500 mg by mouth every 4 (four) hours as needed for mild pain, fever or headache.     [provider]  albuterol (PROVENTIL HFA;VENTOLIN HFA) 108 (90 Base) MCG/ACT inhaler Inhale into the lungs. 12/10/17   [provider]  apixaban (ELIQUIS) 5 MG TABS tablet Take 5 mg by mouth every 12 (twelve) hours. 06/02/18   [provider]  cyclobenzaprine (FLEXERIL) 10 MG tablet Take 10 mg by mouth 3 (three) times daily as needed.  02/22/18  [provider]  HYDROcodone-acetaminophen (NORCO) 5-325 MG tablet Take 1 tablet by mouth every 6 (six) hours as needed for moderate pain. 02/05/19   Stark Jock Jude, MD  ibuprofen (ADVIL,MOTRIN) 200 MG tablet Take 800 mg by mouth every 6 (six) hours as needed for headache or mild pain.    [provider]  PARoxetine (PAXIL) 30 MG tablet Take 30 mg by mouth at bedtime.     [provider]  predniSONE (DELTASONE) 10 MG tablet 60 mg p.o. daily x1 day Then 50 mg p.o. daily x1 day Then 40 mg p.o. daily x1 day Then 30 mg p.o. daily x1 day Then 20 mg p.o. daily x1 day Then 10 mg p.o. daily x1 day 6 02/05/19   Stark Jock, Jude, MD  pregabalin (LYRICA) 50 MG capsule Take 1 capsule (50 mg total) by mouth 3 (three)  times daily. 04/06/18   Gillis Santa, MD  promethazine (PHENERGAN) 12.5 MG tablet Take 12.5-25 mg by mouth every 6 (six) hours as needed for nausea/vomiting. 01/29/19   [provider]  traZODone (DESYREL) 100 MG tablet Take 200 mg by mouth at bedtime.  10/17/15   [provider]    Allergies    Penicillins and Codeine  Review of Systems   Review of Systems  Unable to perform ROS: Mental status change  Cardiovascular: Positive for chest pain.  Musculoskeletal: Positive for back pain.       Leg pain    Physical Exam Updated Vital Signs BP 121/90   Pulse 80   Temp 97.7 F (36.5 C) (Oral)   Resp 14   Ht 5\' 6"  (1.676 m)   Wt 59 kg   LMP 08/10/2019   SpO2 96%   BMI 20.99 kg/m   Physical Exam Vitals and nursing note reviewed.  Constitutional:      General: She is not in acute distress.    Appearance: She is well-developed.  HENT:     Head: Normocephalic.     Mouth/Throat:     Comments: Swelling and abrasion swelling and abrasion noted to the upper lip Eyes:     Extraocular Movements: Extraocular movements intact.     Conjunctiva/sclera: Conjunctivae normal.     Pupils: Pupils are equal, round, and reactive to light.  Cardiovascular:     Rate and Rhythm: Normal rate and regular rhythm.     Heart sounds: No murmur heard.   Pulmonary:     Effort: Pulmonary effort is normal. No respiratory distress.     Breath sounds: Normal breath sounds.  Abdominal:     Palpations: Abdomen is soft.     Tenderness: There is no abdominal tenderness.  Musculoskeletal:     Cervical back: Neck supple.     Comments: No TTP to the cervical or lumbar spine. TTP to the upper thoracic spine. Swelling, erythema, and multiple abrasions to the left hand/wrist. No additional pain with ROM or with palpation to the long bones of the BUE/BLE. Some superficial abrasions noted to the bilat knees  Skin:    General: Skin is warm and dry.  Neurological:     Mental Status: She is alert.      Comments: Somnolent, arouses to sternal rub.  Oriented times amnestic to the event.  Following commands.  Moving all extremities.  Clear speech.  No obvious facial droop.     ED Results / Procedures / Treatments   Labs (all labs ordered are listed, but only abnormal results are displayed) Labs Reviewed  COMPREHENSIVE METABOLIC  PANEL - Abnormal; Notable for the following components:      Result Value   Potassium 3.3 (*)    Glucose, Bld 103 (*)    Total Protein 6.4 (*)    Albumin 3.3 (*)    AST 72 (*)    All other components within normal limits  CBC - Abnormal; Notable for the following components:   WBC 10.7 (*)    All other components within normal limits  LACTIC ACID, PLASMA - Abnormal; Notable for the following components:   Lactic Acid, Venous 3.4 (*)    All other components within normal limits  CK - Abnormal; Notable for the following components:   Total CK 6,898 (*)    All other components within normal limits  I-STAT CHEM 8, ED - Abnormal; Notable for the following components:   Potassium 3.2 (*)    Hemoglobin 15.3 (*)    All other components within normal limits  I-STAT VENOUS BLOOD GAS, ED - Abnormal; Notable for the following components:   pH, Ven 7.471 (*)    Bicarbonate 32.2 (*)    TCO2 33 (*)    Acid-Base Excess 8.0 (*)    Potassium 2.8 (*)    Calcium, Ion 1.11 (*)    All other components within normal limits  RESP PANEL BY RT PCR (RSV, FLU A&B, COVID)  PROTIME-INR  ETHANOL  URINALYSIS, ROUTINE W REFLEX MICROSCOPIC  RAPID URINE DRUG SCREEN, HOSP PERFORMED  BLOOD GAS, VENOUS  MAGNESIUM  SAMPLE TO BLOOD BANK    EKG EKG Interpretation  Date/Time:  Sunday June 10 2020 10:06:52 EDT Ventricular Rate:  77 PR Interval:    QRS Duration: 98 QT Interval:  444 QTC Calculation: 503 R Axis:   -20 Text Interpretation: Sinus rhythm Biatrial enlargement RSR' in V1 or V2, right VCD or RVH Inferior infarct, old No significant change since last tracing Confirmed  by Dorie Rank [phone]) on 06/10/2020 10:25:15 AM   Radiology DG Wrist Complete Left  Result Date: 06/10/2020 CLINICAL DATA:  Status post fall down stairs. EXAM: LEFT WRIST - COMPLETE 3+ VIEW COMPARISON:  None. FINDINGS: There is no evidence of fracture or dislocation. There is no evidence of arthropathy or other focal bone abnormality. Soft tissues are unremarkable. IMPRESSION: Negative. Electronically Signed   By: Fidela Salisbury M.D.   On: 06/10/2020 10:06   CT HEAD WO CONTRAST  Result Date: 06/10/2020 CLINICAL DATA:  Fall EXAM: CT HEAD WITHOUT CONTRAST CT MAXILLOFACIAL WITHOUT CONTRAST CT CERVICAL SPINE WITHOUT CONTRAST TECHNIQUE: Multidetector CT imaging of the head, cervical spine, and maxillofacial structures were performed using the standard protocol without intravenous contrast. Multiplanar CT image reconstructions of the cervical spine and maxillofacial structures were also generated. COMPARISON:  CT cervical spine 2019 FINDINGS: CT HEAD FINDINGS Brain: There is no acute intracranial hemorrhage, mass effect, or edema. Gray-white differentiation is preserved. Ventricles and sulci are size and configuration. There is no extra-axial collection. Vascular: Unremarkable Skull: Intact. Other: Minimal passive opacification of the mastoid air cells. CT MAXILLOFACIAL FINDINGS Osseous: No acute facial fracture. Orbits: No intraorbital hematoma. Sinuses: Evidence of prior sinonasal surgery. Mild polypoid mucosal thickening. Soft tissues: Negative. CT CERVICAL SPINE FINDINGS Alignment: Preserved. Skull base and vertebrae: Interbody fusion device at C5-C6 with associated streak artifact. Vertebral body heights are maintained. There is no acute fracture. Soft tissues and spinal canal: No prevertebral fluid or swelling. No visible canal hematoma. Disc levels: Mild multilevel degenerative changes. Appearance is similar to the prior study. Upper chest: Negative. Other: None.  IMPRESSION: No evidence of acute  intracranial injury, acute facial fracture, or acute cervical spine fracture. Electronically Signed   By: Macy Mis M.D.   On: 06/10/2020 11:07   CT CHEST W CONTRAST  Result Date: 06/10/2020 CLINICAL DATA:  Abdominal trauma.  Status post fall. EXAM: CT CHEST, ABDOMEN, AND PELVIS WITH CONTRAST TECHNIQUE: Multidetector CT imaging of the chest, abdomen and pelvis was performed following the standard protocol during bolus administration of intravenous contrast. CONTRAST:  166mL OMNIPAQUE IOHEXOL 300 MG/ML  SOLN COMPARISON:  August 22, 2019 FINDINGS: CT CHEST FINDINGS Cardiovascular: No significant vascular findings. Normal heart size. No pericardial effusion. Mediastinum/Nodes: No enlarged mediastinal, hilar, or axillary lymph nodes. Thyroid gland, trachea, and esophagus demonstrate no significant findings. Lungs/Pleura: Right middle lobe atelectasis versus scarring. Mild subpleural interstitial changes in both lungs. Musculoskeletal: No chest wall mass or suspicious bone lesions identified. CT ABDOMEN PELVIS FINDINGS Hepatobiliary: Hepatic steatosis. Post cholecystectomy. No biliary ductal dilation. Pancreas: Somewhat atrophic appearance of the pancreas. No focal masses. Spleen: Normal in size without focal abnormality. Adrenals/Urinary Tract: Adrenal glands are unremarkable. Kidneys are normal, without renal calculi, focal lesion, or hydronephrosis. Bladder is unremarkable. Stomach/Bowel: Stomach is within normal limits. No evidence of appendicitis. No evidence of bowel wall thickening, distention, or inflammatory changes. Vascular/Lymphatic: Aortic atherosclerosis. No enlarged abdominal or pelvic lymph nodes. Reproductive: Posterior uterine fibroid noted.  No adnexal masses. Other: No abdominal wall hernia or abnormality. No abdominopelvic ascites. Musculoskeletal: No fracture is seen. L5-S1 posterior fusion with intact hardware. IMPRESSION: 1. No evidence of acute traumatic injury to the chest, abdomen or  pelvis. 2. Hepatic steatosis. 3. Posterior uterine fibroid. 4. Right middle lobe atelectasis versus scarring. 5. Mild subpleural interstitial changes in both lungs. Aortic Atherosclerosis (ICD10-I70.0). Electronically Signed   By: Fidela Salisbury M.D.   On: 06/10/2020 11:07   CT CERVICAL SPINE WO CONTRAST  Result Date: 06/10/2020 CLINICAL DATA:  Fall EXAM: CT HEAD WITHOUT CONTRAST CT MAXILLOFACIAL WITHOUT CONTRAST CT CERVICAL SPINE WITHOUT CONTRAST TECHNIQUE: Multidetector CT imaging of the head, cervical spine, and maxillofacial structures were performed using the standard protocol without intravenous contrast. Multiplanar CT image reconstructions of the cervical spine and maxillofacial structures were also generated. COMPARISON:  CT cervical spine 2019 FINDINGS: CT HEAD FINDINGS Brain: There is no acute intracranial hemorrhage, mass effect, or edema. Gray-white differentiation is preserved. Ventricles and sulci are size and configuration. There is no extra-axial collection. Vascular: Unremarkable Skull: Intact. Other: Minimal passive opacification of the mastoid air cells. CT MAXILLOFACIAL FINDINGS Osseous: No acute facial fracture. Orbits: No intraorbital hematoma. Sinuses: Evidence of prior sinonasal surgery. Mild polypoid mucosal thickening. Soft tissues: Negative. CT CERVICAL SPINE FINDINGS Alignment: Preserved. Skull base and vertebrae: Interbody fusion device at C5-C6 with associated streak artifact. Vertebral body heights are maintained. There is no acute fracture. Soft tissues and spinal canal: No prevertebral fluid or swelling. No visible canal hematoma. Disc levels: Mild multilevel degenerative changes. Appearance is similar to the prior study. Upper chest: Negative. Other: None. IMPRESSION: No evidence of acute intracranial injury, acute facial fracture, or acute cervical spine fracture. Electronically Signed   By: Macy Mis M.D.   On: 06/10/2020 11:07   CT ABDOMEN PELVIS W  CONTRAST  Result Date: 06/10/2020 CLINICAL DATA:  Abdominal trauma.  Status post fall. EXAM: CT CHEST, ABDOMEN, AND PELVIS WITH CONTRAST TECHNIQUE: Multidetector CT imaging of the chest, abdomen and pelvis was performed following the standard protocol during bolus administration of intravenous contrast. CONTRAST:  166mL OMNIPAQUE IOHEXOL 300 MG/ML  SOLN COMPARISON:  August 22, 2019 FINDINGS: CT CHEST FINDINGS Cardiovascular: No significant vascular findings. Normal heart size. No pericardial effusion. Mediastinum/Nodes: No enlarged mediastinal, hilar, or axillary lymph nodes. Thyroid gland, trachea, and esophagus demonstrate no significant findings. Lungs/Pleura: Right middle lobe atelectasis versus scarring. Mild subpleural interstitial changes in both lungs. Musculoskeletal: No chest wall mass or suspicious bone lesions identified. CT ABDOMEN PELVIS FINDINGS Hepatobiliary: Hepatic steatosis. Post cholecystectomy. No biliary ductal dilation. Pancreas: Somewhat atrophic appearance of the pancreas. No focal masses. Spleen: Normal in size without focal abnormality. Adrenals/Urinary Tract: Adrenal glands are unremarkable. Kidneys are normal, without renal calculi, focal lesion, or hydronephrosis. Bladder is unremarkable. Stomach/Bowel: Stomach is within normal limits. No evidence of appendicitis. No evidence of bowel wall thickening, distention, or inflammatory changes. Vascular/Lymphatic: Aortic atherosclerosis. No enlarged abdominal or pelvic lymph nodes. Reproductive: Posterior uterine fibroid noted.  No adnexal masses. Other: No abdominal wall hernia or abnormality. No abdominopelvic ascites. Musculoskeletal: No fracture is seen. L5-S1 posterior fusion with intact hardware. IMPRESSION: 1. No evidence of acute traumatic injury to the chest, abdomen or pelvis. 2. Hepatic steatosis. 3. Posterior uterine fibroid. 4. Right middle lobe atelectasis versus scarring. 5. Mild subpleural interstitial changes in both  lungs. Aortic Atherosclerosis (ICD10-I70.0). Electronically Signed   By: Fidela Salisbury M.D.   On: 06/10/2020 11:07   DG Pelvis Portable  Result Date: 06/10/2020 CLINICAL DATA:  Status post fall down stairs. EXAM: PORTABLE PELVIS 1-2 VIEWS COMPARISON:  None. FINDINGS: There is no evidence of pelvic fracture or diastasis. No pelvic bone lesions are seen. Prior L5-S1 fusion. IMPRESSION: Negative. Electronically Signed   By: Fidela Salisbury M.D.   On: 06/10/2020 09:56   DG Chest Port 1 View  Result Date: 06/10/2020 CLINICAL DATA:  Status post fall down stairs. EXAM: PORTABLE CHEST 1 VIEW COMPARISON:  January 30, 2018 FINDINGS: Cardiomediastinal silhouette is normal. Mediastinal contours appear intact. There is no evidence of focal airspace consolidation, pleural effusion or pneumothorax. Osseous structures are without acute abnormality. Soft tissues are grossly normal. IMPRESSION: No active disease. Electronically Signed   By: Fidela Salisbury M.D.   On: 06/10/2020 09:55   DG Hand Complete Left  Result Date: 06/10/2020 CLINICAL DATA:  Status post fall down stairs. EXAM: LEFT HAND - COMPLETE 3+ VIEW COMPARISON:  None. FINDINGS: There is no evidence of fracture or dislocation. There is no evidence of arthropathy or other focal bone abnormality. Soft tissues are unremarkable. IMPRESSION: Negative. Electronically Signed   By: Fidela Salisbury M.D.   On: 06/10/2020 09:57   CT MAXILLOFACIAL WO CONTRAST  Result Date: 06/10/2020 CLINICAL DATA:  Fall EXAM: CT HEAD WITHOUT CONTRAST CT MAXILLOFACIAL WITHOUT CONTRAST CT CERVICAL SPINE WITHOUT CONTRAST TECHNIQUE: Multidetector CT imaging of the head, cervical spine, and maxillofacial structures were performed using the standard protocol without intravenous contrast. Multiplanar CT image reconstructions of the cervical spine and maxillofacial structures were also generated. COMPARISON:  CT cervical spine 2019 FINDINGS: CT HEAD FINDINGS Brain: There is  no acute intracranial hemorrhage, mass effect, or edema. Gray-white differentiation is preserved. Ventricles and sulci are size and configuration. There is no extra-axial collection. Vascular: Unremarkable Skull: Intact. Other: Minimal passive opacification of the mastoid air cells. CT MAXILLOFACIAL FINDINGS Osseous: No acute facial fracture. Orbits: No intraorbital hematoma. Sinuses: Evidence of prior sinonasal surgery. Mild polypoid mucosal thickening. Soft tissues: Negative. CT CERVICAL SPINE FINDINGS Alignment: Preserved. Skull base and vertebrae: Interbody fusion device at C5-C6 with associated streak artifact. Vertebral body heights are maintained. There is no acute  fracture. Soft tissues and spinal canal: No prevertebral fluid or swelling. No visible canal hematoma. Disc levels: Mild multilevel degenerative changes. Appearance is similar to the prior study. Upper chest: Negative. Other: None. IMPRESSION: No evidence of acute intracranial injury, acute facial fracture, or acute cervical spine fracture. Electronically Signed   By: Macy Mis M.D.   On: 06/10/2020 11:07    Procedures Procedures (including critical care time)  Medications Ordered in ED Medications  iohexol (OMNIPAQUE) 300 MG/ML solution 100 mL (100 mLs Intravenous Contrast Given 06/10/20 1035)  sodium chloride 0.9 % bolus 1,000 mL (1,000 mLs Intravenous New Bag/Given 06/10/20 1140)    ED Course  I have reviewed the triage vital signs and the nursing notes.  Pertinent labs & imaging results that were available during my care of the patient were reviewed by me and considered in my medical decision making (see chart for details).    MDM Rules/Calculators/A&P                          54 year old female presenting the emergency department today for evaluation after an alleged fall.  EMS reports they found her at the bottom of 2 flights of stairs, things were at the top of the stairs to their suspicion that she fell the way  down the 2 flights.  She laid on the ground outside for several hours prior to being brought to the emergency department.  Reviewed/interpreted labs CBC shows mild leukocytosis at 10.7 CMP with mild hypokalemia, potassium 3.3, AST slightly elevated at 72, otherwise reassuring INR normal Lactic acid L EtOH negative CK elevated above 6000   -Consistent with rhabdo, likely from prolonged laying on the ground following fall, IV fluid hydration initiated UDS pending at the time of admission COVID pending at the time of admission  Imaging reviewed/interpreted CXR - neg Pelvis xray - neg  CT head/cervical spine/maxillofacial - No evidence of acute intracranial injury, acute facial fracture, or acute cervical spine fracture. CT chest/abdomen/pelvis - 1. No evidence of acute traumatic injury to the chest, abdomen or pelvis. 2. Hepatic steatosis. 3. Posterior uterine fibroid. 4. Right middle lobe atelectasis versus scarring. 5. Mild subpleural interstitial changes in both lungs. 6. Aortic Atherosclerosis X-ray left wrist -neg X-ray left hand - neg  MDM: Patient with fall with significant mechanism of injury.  Trauma work-up reassuring however patient in rhabdo.  IV fluids initiated.  Will admit for further treatment.  11:56 PM CONSULT with Dr. Marcello Moores accepts patient for admission  Final Clinical Impression(s) / ED Diagnoses Final diagnoses:  Fall  Non-traumatic rhabdomyolysis    Rx / DC Orders ED Discharge Orders    None       Bishop Dublin 06/10/20 1357    Dorie Rank, MD 06/11/20 0730

## 2020-06-10 NOTE — ED Notes (Signed)
Patient had pure wick in x 1 hour and pulled it off her, voided on bed linens. She pulled off all monitors. Patient calmed with reassurance and was back asleep and snoring during peri care/ diaper being put on, and linen change. Patient in clean gown with warm blankets x 2. Resting comfortably. Patient back on all monitors at this time.

## 2020-06-10 NOTE — ED Notes (Signed)
Patient transported to CT 

## 2020-06-10 NOTE — ED Notes (Signed)
Pure wick placed. Patient placed in hospital gown and warm blankets for comfort.
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
## 2020-06-11 ENCOUNTER — Observation Stay (HOSPITAL_BASED_OUTPATIENT_CLINIC_OR_DEPARTMENT_OTHER): Payer: Medicare HMO

## 2020-06-11 DIAGNOSIS — S060X9A Concussion with loss of consciousness of unspecified duration, initial encounter: Secondary | ICD-10-CM | POA: Diagnosis not present

## 2020-06-11 DIAGNOSIS — R9431 Abnormal electrocardiogram [ECG] [EKG]: Secondary | ICD-10-CM

## 2020-06-11 LAB — BASIC METABOLIC PANEL
Anion gap: 10 (ref 5–15)
BUN: 12 mg/dL (ref 6–20)
CO2: 27 mmol/L (ref 22–32)
Calcium: 8.5 mg/dL — ABNORMAL LOW (ref 8.9–10.3)
Chloride: 103 mmol/L (ref 98–111)
Creatinine, Ser: 0.71 mg/dL (ref 0.44–1.00)
GFR, Estimated: >60 mL/min (ref >60)
Glucose, Bld: 106 mg/dL — ABNORMAL HIGH (ref 70–99)
Potassium: 2.4 mmol/L — CL (ref 3.5–5.1)
Sodium: 140 mmol/L (ref 135–145)

## 2020-06-11 LAB — CK: Total CK: 2141 U/L — ABNORMAL HIGH (ref 38–234)

## 2020-06-11 LAB — CBC
HCT: 36.4 % (ref 36.0–46.0)
Hemoglobin: 11.9 g/dL — ABNORMAL LOW (ref 12.0–15.0)
MCH: 29.5 pg (ref 26.0–34.0)
MCHC: 32.7 g/dL (ref 30.0–36.0)
MCV: 90.3 fL (ref 80.0–100.0)
Platelets: 248 10*3/uL (ref 150–400)
RBC: 4.03 MIL/uL (ref 3.87–5.11)
RDW: 14.6 % (ref 11.5–15.5)
WBC: 5.8 10*3/uL (ref 4.0–10.5)
nRBC: 0 % (ref 0.0–0.2)

## 2020-06-11 LAB — MAGNESIUM: Magnesium: 1.7 mg/dL (ref 1.7–2.4)

## 2020-06-11 LAB — ECHOCARDIOGRAM COMPLETE
Height: 66 in
S' Lateral: 1.9 cm
Weight: 2081.14 oz

## 2020-06-11 MED ORDER — TRAMADOL HCL 50 MG PO TABS: 100.0000 mg | ORAL_TABLET | Freq: Two times a day (BID) | ORAL | 0 refills | Status: AC | PRN

## 2020-06-11 MED ORDER — MAGNESIUM SULFATE 2 GM/50ML IV SOLN: 2.0000 g | Freq: Once | INTRAVENOUS | Status: DC

## 2020-06-11 MED ORDER — TRAMADOL HCL 50 MG PO TABS
50.0000 mg | ORAL_TABLET | Freq: Four times a day (QID) | ORAL | Status: DC | PRN
  Administered 2020-06-11: 50 mg via ORAL
  Filled 2020-06-11: qty 1

## 2020-06-11 MED ORDER — POTASSIUM CHLORIDE CRYS ER 20 MEQ PO TBCR
40.0000 meq | EXTENDED_RELEASE_TABLET | Freq: Once | ORAL | Status: AC
  Administered 2020-06-11: 40 meq via ORAL
  Filled 2020-06-11: qty 2

## 2020-06-11 MED ORDER — POTASSIUM CHLORIDE 10 MEQ/100ML IV SOLN
10.0000 meq | INTRAVENOUS | Status: AC
  Administered 2020-06-11 (×4): 10 meq via INTRAVENOUS
  Filled 2020-06-11 (×4): qty 100

## 2020-06-11 MED ORDER — KETOROLAC TROMETHAMINE 15 MG/ML IJ SOLN
15.0000 mg | Freq: Once | INTRAMUSCULAR | Status: AC
  Administered 2020-06-11: 15 mg via INTRAVENOUS
  Filled 2020-06-11: qty 1

## 2020-06-11 MED ORDER — POTASSIUM CHLORIDE ER 20 MEQ PO TBCR: 20.0000 meq | EXTENDED_RELEASE_TABLET | Freq: Every evening | ORAL | 0 refills | Status: AC
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
## 2020-06-11 NOTE — Discharge Summary (Signed)
Physician Discharge Summary  NIKAELA COYNE VQQ:[id number] DOB: 1965/11/26 DOA: 06/10/2020  PCP: Dion Body, MD  Admit date: 06/10/2020 Discharge date: 06/11/2020  Admitted From: Home  Disposition:  Home   Recommendations for Outpatient Follow-up:  1. Follow up with PCP in 1-2 weeks 2. Dr. Netty Starring: Please obtain repeat K level in 1 week     Home Health: None  Equipment/Devices: None  Discharge Condition: Good  CODE STATUS: FULL Diet recommendation: Regular  Brief/Interim Summary: Mrs. Tullo is a 54 y.o. 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  Bystanders called EMS for patient had fallen down stairs.  Last seen at 5 AM, found around 9 AM at bottom of stairs, belongings at top of stairs, about 10 steps.  Patient with headache, left sided bruising, scrapes. She was unable to remember any details of fall, injury.        PRINCIPAL HOSPITAL DIAGNOSIS: Fall and concussion    Discharge Diagnoses:   Fall Patient with opiates and benzodiazepines in UDS on admission, denied use.  Ethanol negative.  Endorsed 1-2 drink alcohol use.    On work up here, x-rays of left arm, CT of head, chest abdomen and pelvis were unremarkable, no injuries noted.  Patient observed overnight and was at baseline other than pain from fall/bruising.   Concussion Patient's headache and amnesia suggest a concussion.  Anticipatory guidance suggested.  No further treatment at this time.  Hypokalemia Potassium down to 2.4 mmol/L here.  Supplemented K and discharged with oral K and close PCP follow up.      Discharge Instructions  Discharge Instructions    Diet - low sodium heart healthy   Complete by: As directed    Discharge instructions   Complete by: As directed    You were admitted for a fall and likely a concussion.  You should rest and slowly resume your normal activities.  No particular precautions  are needed for the concussion.  Some additional information is included below.  For the bruising, I would recommend primarily that you take: Ibuprofen 600 mg (three tabs) three times daily for the next week If you have additional pain, take low dose tramadol in addition.  For the low potassium: Take potassium 20 mEq once nightly for the next 2 weeks Call your primary care doctor to have your potassium rechecked   Increase activity slowly   Complete by: As directed      Allergies as of 06/11/2020      Reactions   Penicillins Hives, Swelling, Other (See Comments)   Has patient had a PCN reaction causing immediate rash, facial/tongue/throat swelling, SOB or lightheadedness with hypotension: No Has patient had a PCN reaction causing severe rash involving mucus membranes or skin necrosis: No Has patient had a PCN reaction that required hospitalization No Has patient had a PCN reaction occurring within the last 10 years: No If all of the above answers are "NO", then may proceed with Cephalosporin use. Other reaction(s): SWELLING   Alcohol    Other reaction(s): Other (See Comments) Cannot drink alcohol, it triggers migraines.   Codeine Itching   Tramadol    Other reaction(s): Headache, Other (See Comments)  headache      Medication List    STOP taking these medications   aspirin 325 MG tablet     TAKE these medications   PARoxetine 30 MG tablet Commonly known as: PAXIL Take 30 mg by mouth at  bedtime.   Potassium Chloride ER 20 MEQ Tbcr Take 20 mEq by mouth at bedtime for 14 days.   traMADol 50 MG tablet Commonly known as: ULTRAM Take 2 tablets (100 mg total) by mouth every 12 (twelve) hours as needed. What changed:   when to take this  reasons to take this       Allergies  Allergen Reactions  . Penicillins Hives, Swelling and Other (See Comments)    Has patient had a PCN reaction causing immediate rash, facial/tongue/throat swelling, SOB or lightheadedness with  hypotension: No Has patient had a PCN reaction causing severe rash involving mucus membranes or skin necrosis: No Has patient had a PCN reaction that required hospitalization No Has patient had a PCN reaction occurring within the last 10 years: No If all of the above answers are "NO", then may proceed with Cephalosporin use.  Other reaction(s): SWELLING   . Alcohol     Other reaction(s): Other (See Comments) Cannot drink alcohol, it triggers migraines.  . Codeine Itching  . Tramadol     Other reaction(s): Headache, Other (See Comments)  headache     Consultations:  None   Procedures/Studies: DG Wrist Complete Left  Result Date: 06/10/2020 CLINICAL DATA:  Status post fall down stairs. EXAM: LEFT WRIST - COMPLETE 3+ VIEW COMPARISON:  None. FINDINGS: There is no evidence of fracture or dislocation. There is no evidence of arthropathy or other focal bone abnormality. Soft tissues are unremarkable. IMPRESSION: Negative. Electronically Signed   By: Fidela Salisbury M.D.   On: 06/10/2020 10:06   CT HEAD WO CONTRAST  Result Date: 06/10/2020 CLINICAL DATA:  Fall EXAM: CT HEAD WITHOUT CONTRAST CT MAXILLOFACIAL WITHOUT CONTRAST CT CERVICAL SPINE WITHOUT CONTRAST TECHNIQUE: Multidetector CT imaging of the head, cervical spine, and maxillofacial structures were performed using the standard protocol without intravenous contrast. Multiplanar CT image reconstructions of the cervical spine and maxillofacial structures were also generated. COMPARISON:  CT cervical spine 2019 FINDINGS: CT HEAD FINDINGS Brain: There is no acute intracranial hemorrhage, mass effect, or edema. Gray-white differentiation is preserved. Ventricles and sulci are size and configuration. There is no extra-axial collection. Vascular: Unremarkable Skull: Intact. Other: Minimal passive opacification of the mastoid air cells. CT MAXILLOFACIAL FINDINGS Osseous: No acute facial fracture. Orbits: No intraorbital hematoma. Sinuses:  Evidence of prior sinonasal surgery. Mild polypoid mucosal thickening. Soft tissues: Negative. CT CERVICAL SPINE FINDINGS Alignment: Preserved. Skull base and vertebrae: Interbody fusion device at C5-C6 with associated streak artifact. Vertebral body heights are maintained. There is no acute fracture. Soft tissues and spinal canal: No prevertebral fluid or swelling. No visible canal hematoma. Disc levels: Mild multilevel degenerative changes. Appearance is similar to the prior study. Upper chest: Negative. Other: None. IMPRESSION: No evidence of acute intracranial injury, acute facial fracture, or acute cervical spine fracture. Electronically Signed   By: Macy Mis M.D.   On: 06/10/2020 11:07   CT CHEST W CONTRAST  Result Date: 06/10/2020 CLINICAL DATA:  Abdominal trauma.  Status post fall. EXAM: CT CHEST, ABDOMEN, AND PELVIS WITH CONTRAST TECHNIQUE: Multidetector CT imaging of the chest, abdomen and pelvis was performed following the standard protocol during bolus administration of intravenous contrast. CONTRAST:  132mL OMNIPAQUE IOHEXOL 300 MG/ML  SOLN COMPARISON:  August 22, 2019 FINDINGS: CT CHEST FINDINGS Cardiovascular: No significant vascular findings. Normal heart size. No pericardial effusion. Mediastinum/Nodes: No enlarged mediastinal, hilar, or axillary lymph nodes. Thyroid gland, trachea, and esophagus demonstrate no significant findings. Lungs/Pleura: Right middle lobe atelectasis versus scarring. Mild  subpleural interstitial changes in both lungs. Musculoskeletal: No chest wall mass or suspicious bone lesions identified. CT ABDOMEN PELVIS FINDINGS Hepatobiliary: Hepatic steatosis. Post cholecystectomy. No biliary ductal dilation. Pancreas: Somewhat atrophic appearance of the pancreas. No focal masses. Spleen: Normal in size without focal abnormality. Adrenals/Urinary Tract: Adrenal glands are unremarkable. Kidneys are normal, without renal calculi, focal lesion, or hydronephrosis. Bladder is  unremarkable. Stomach/Bowel: Stomach is within normal limits. No evidence of appendicitis. No evidence of bowel wall thickening, distention, or inflammatory changes. Vascular/Lymphatic: Aortic atherosclerosis. No enlarged abdominal or pelvic lymph nodes. Reproductive: Posterior uterine fibroid noted.  No adnexal masses. Other: No abdominal wall hernia or abnormality. No abdominopelvic ascites. Musculoskeletal: No fracture is seen. L5-S1 posterior fusion with intact hardware. IMPRESSION: 1. No evidence of acute traumatic injury to the chest, abdomen or pelvis. 2. Hepatic steatosis. 3. Posterior uterine fibroid. 4. Right middle lobe atelectasis versus scarring. 5. Mild subpleural interstitial changes in both lungs. Aortic Atherosclerosis (ICD10-I70.0). Electronically Signed   By: Fidela Salisbury M.D.   On: 06/10/2020 11:07   CT CERVICAL SPINE WO CONTRAST  Result Date: 06/10/2020 CLINICAL DATA:  Fall EXAM: CT HEAD WITHOUT CONTRAST CT MAXILLOFACIAL WITHOUT CONTRAST CT CERVICAL SPINE WITHOUT CONTRAST TECHNIQUE: Multidetector CT imaging of the head, cervical spine, and maxillofacial structures were performed using the standard protocol without intravenous contrast. Multiplanar CT image reconstructions of the cervical spine and maxillofacial structures were also generated. COMPARISON:  CT cervical spine 2019 FINDINGS: CT HEAD FINDINGS Brain: There is no acute intracranial hemorrhage, mass effect, or edema. Gray-white differentiation is preserved. Ventricles and sulci are size and configuration. There is no extra-axial collection. Vascular: Unremarkable Skull: Intact. Other: Minimal passive opacification of the mastoid air cells. CT MAXILLOFACIAL FINDINGS Osseous: No acute facial fracture. Orbits: No intraorbital hematoma. Sinuses: Evidence of prior sinonasal surgery. Mild polypoid mucosal thickening. Soft tissues: Negative. CT CERVICAL SPINE FINDINGS Alignment: Preserved. Skull base and vertebrae: Interbody fusion  device at C5-C6 with associated streak artifact. Vertebral body heights are maintained. There is no acute fracture. Soft tissues and spinal canal: No prevertebral fluid or swelling. No visible canal hematoma. Disc levels: Mild multilevel degenerative changes. Appearance is similar to the prior study. Upper chest: Negative. Other: None. IMPRESSION: No evidence of acute intracranial injury, acute facial fracture, or acute cervical spine fracture. Electronically Signed   By: Macy Mis M.D.   On: 06/10/2020 11:07   CT ABDOMEN PELVIS W CONTRAST  Result Date: 06/10/2020 CLINICAL DATA:  Abdominal trauma.  Status post fall. EXAM: CT CHEST, ABDOMEN, AND PELVIS WITH CONTRAST TECHNIQUE: Multidetector CT imaging of the chest, abdomen and pelvis was performed following the standard protocol during bolus administration of intravenous contrast. CONTRAST:  150mL OMNIPAQUE IOHEXOL 300 MG/ML  SOLN COMPARISON:  August 22, 2019 FINDINGS: CT CHEST FINDINGS Cardiovascular: No significant vascular findings. Normal heart size. No pericardial effusion. Mediastinum/Nodes: No enlarged mediastinal, hilar, or axillary lymph nodes. Thyroid gland, trachea, and esophagus demonstrate no significant findings. Lungs/Pleura: Right middle lobe atelectasis versus scarring. Mild subpleural interstitial changes in both lungs. Musculoskeletal: No chest wall mass or suspicious bone lesions identified. CT ABDOMEN PELVIS FINDINGS Hepatobiliary: Hepatic steatosis. Post cholecystectomy. No biliary ductal dilation. Pancreas: Somewhat atrophic appearance of the pancreas. No focal masses. Spleen: Normal in size without focal abnormality. Adrenals/Urinary Tract: Adrenal glands are unremarkable. Kidneys are normal, without renal calculi, focal lesion, or hydronephrosis. Bladder is unremarkable. Stomach/Bowel: Stomach is within normal limits. No evidence of appendicitis. No evidence of bowel wall thickening, distention, or inflammatory changes.  Vascular/Lymphatic: Aortic atherosclerosis. No enlarged abdominal or pelvic lymph nodes. Reproductive: Posterior uterine fibroid noted.  No adnexal masses. Other: No abdominal wall hernia or abnormality. No abdominopelvic ascites. Musculoskeletal: No fracture is seen. L5-S1 posterior fusion with intact hardware. IMPRESSION: 1. No evidence of acute traumatic injury to the chest, abdomen or pelvis. 2. Hepatic steatosis. 3. Posterior uterine fibroid. 4. Right middle lobe atelectasis versus scarring. 5. Mild subpleural interstitial changes in both lungs. Aortic Atherosclerosis (ICD10-I70.0). Electronically Signed   By: Fidela Salisbury M.D.   On: 06/10/2020 11:07   DG Pelvis Portable  Result Date: 06/10/2020 CLINICAL DATA:  Status post fall down stairs. EXAM: PORTABLE PELVIS 1-2 VIEWS COMPARISON:  None. FINDINGS: There is no evidence of pelvic fracture or diastasis. No pelvic bone lesions are seen. Prior L5-S1 fusion. IMPRESSION: Negative. Electronically Signed   By: Fidela Salisbury M.D.   On: 06/10/2020 09:56   DG Chest Port 1 View  Result Date: 06/10/2020 CLINICAL DATA:  Status post fall down stairs. EXAM: PORTABLE CHEST 1 VIEW COMPARISON:  January 30, 2018 FINDINGS: Cardiomediastinal silhouette is normal. Mediastinal contours appear intact. There is no evidence of focal airspace consolidation, pleural effusion or pneumothorax. Osseous structures are without acute abnormality. Soft tissues are grossly normal. IMPRESSION: No active disease. Electronically Signed   By: Fidela Salisbury M.D.   On: 06/10/2020 09:55   DG Hand Complete Left  Result Date: 06/10/2020 CLINICAL DATA:  Status post fall down stairs. EXAM: LEFT HAND - COMPLETE 3+ VIEW COMPARISON:  None. FINDINGS: There is no evidence of fracture or dislocation. There is no evidence of arthropathy or other focal bone abnormality. Soft tissues are unremarkable. IMPRESSION: Negative. Electronically Signed   By: Fidela Salisbury M.D.   On:  06/10/2020 09:57   ECHOCARDIOGRAM COMPLETE  Result Date: 06/11/2020    ECHOCARDIOGRAM REPORT   Patient Name:   DONNALYNN WHEELESS Date of Exam: 06/11/2020 Medical Rec #:  [id number]        Height:       66.0 in Accession #:    [id number]       Weight:       130.1 lb Date of Birth:  [birth year]         BSA:          1.666 m Patient Age:    54 years         BP:           105/80 mmHg Patient Gender: F                HR:           83 bpm. Exam Location:  Inpatient Procedure: 2D Echo, Cardiac Doppler and Color Doppler Indications:    R94.31 Abnormal EKG  History:        Patient has no prior history of Echocardiogram examinations.  Sonographer:    Bernadene Person RDCS Referring Phys: [id number] [place]  1. Left ventricular ejection fraction, by estimation, is 60 to 65%. The left ventricle has normal function. The left ventricle has no regional wall motion abnormalities. There is mild concentric left ventricular hypertrophy. Left ventricular diastolic parameters are consistent with Grade I diastolic dysfunction (impaired relaxation).  2. Right ventricular systolic function is normal. The right ventricular size is normal.  3. The mitral valve is normal in structure. Trivial mitral valve regurgitation.  4. The aortic valve is tricuspid. There is mild thickening of the aortic valve. Aortic valve regurgitation is not visualized.  5. The inferior vena cava is normal in size with greater than 50% respiratory variability, suggesting right atrial pressure of 3 mmHg. Comparison(s): No prior Echocardiogram. FINDINGS  Left Ventricle: Left ventricular ejection fraction, by estimation, is 60 to 65%. The left ventricle has normal function. The left ventricle has no regional wall motion abnormalities. The left ventricular internal cavity size was normal in size. There is  mild concentric left ventricular hypertrophy. Left ventricular diastolic parameters are consistent with Grade I diastolic dysfunction (impaired  relaxation). Right Ventricle: The right ventricular size is normal. No increase in right ventricular wall thickness. Right ventricular systolic function is normal. Left Atrium: Left atrial size was normal in size. Right Atrium: Right atrial size was normal in size. Pericardium: There is no evidence of pericardial effusion. Mitral Valve: The mitral valve is normal in structure. There is mild thickening of the mitral valve leaflet(s). Trivial mitral valve regurgitation. Tricuspid Valve: The tricuspid valve is normal in structure. Tricuspid valve regurgitation is trivial. Aortic Valve: The aortic valve is tricuspid. There is mild thickening of the aortic valve. Aortic valve regurgitation is not visualized. Pulmonic Valve: The pulmonic valve was normal in structure. Pulmonic valve regurgitation is trivial. Aorta: The aortic root and ascending aorta are structurally normal, with no evidence of dilitation. Venous: The inferior vena cava is normal in size with greater than 50% respiratory variability, suggesting right atrial pressure of 3 mmHg. IAS/Shunts: No atrial level shunt detected by color flow Doppler.  LEFT VENTRICLE PLAX 2D LVIDd:         3.30 cm LVIDs:         1.90 cm LV PW:         1.20 cm LV IVS:        1.20 cm LVOT diam:     2.00 cm LV SV:         79 LV SV Index:   47 LVOT Area:     3.14 cm  RIGHT VENTRICLE TAPSE (M-mode): 1.8 cm LEFT ATRIUM             Index       RIGHT ATRIUM           Index LA diam:        2.50 cm 1.50 cm/m  RA Area:     12.10 cm LA Vol (A2C):   38.3 ml 22.99 ml/m RA Volume:   27.00 ml  16.21 ml/m LA Vol (A4C):   47.0 ml 28.22 ml/m LA Biplane Vol: 43.1 ml 25.88 ml/m  AORTIC VALVE LVOT Vmax:   123.67 cm/s LVOT Vmean:  86.067 cm/s LVOT VTI:    0.251 m  AORTA Ao Root diam: 3.40 cm Ao Asc diam:  3.50 cm  SHUNTS Systemic VTI:  0.25 m Systemic Diam: 2.00 cm Gwyndolyn Kaufman MD Electronically signed by Gwyndolyn Kaufman MD Signature Date/Time: 06/11/2020/10:56:02 AM    Final    CT  MAXILLOFACIAL WO CONTRAST  Result Date: 06/10/2020 CLINICAL DATA:  Fall EXAM: CT HEAD WITHOUT CONTRAST CT MAXILLOFACIAL WITHOUT CONTRAST CT CERVICAL SPINE WITHOUT CONTRAST TECHNIQUE: Multidetector CT imaging of the head, cervical spine, and maxillofacial structures were performed using the standard protocol without intravenous contrast. Multiplanar CT image reconstructions of the cervical spine and maxillofacial structures were also generated. COMPARISON:  CT cervical spine 2019 FINDINGS: CT HEAD FINDINGS Brain: There is no acute intracranial hemorrhage, mass effect, or edema. Gray-white differentiation is preserved. Ventricles and sulci are size and configuration. There is no extra-axial collection. Vascular: Unremarkable Skull: Intact.  Other: Minimal passive opacification of the mastoid air cells. CT MAXILLOFACIAL FINDINGS Osseous: No acute facial fracture. Orbits: No intraorbital hematoma. Sinuses: Evidence of prior sinonasal surgery. Mild polypoid mucosal thickening. Soft tissues: Negative. CT CERVICAL SPINE FINDINGS Alignment: Preserved. Skull base and vertebrae: Interbody fusion device at C5-C6 with associated streak artifact. Vertebral body heights are maintained. There is no acute fracture. Soft tissues and spinal canal: No prevertebral fluid or swelling. No visible canal hematoma. Disc levels: Mild multilevel degenerative changes. Appearance is similar to the prior study. Upper chest: Negative. Other: None. IMPRESSION: No evidence of acute intracranial injury, acute facial fracture, or acute cervical spine fracture. Electronically Signed   By: Macy Mis M.D.   On: 06/10/2020 11:07       Subjective: Patient sore all over. Headache noted.  No confusion.  No focal weakness.  No dizziness.  Discharge Exam: Vitals:   06/11/20 0720 06/11/20 1409  BP:  110/80  Pulse: 97 92  Resp: 18 18  Temp:  (!) 97.5 F (36.4 C)  SpO2: 93% 99%   Vitals:   06/11/20 0205 06/11/20 0505 06/11/20 0720  06/11/20 1409  BP:  105/80  110/80  Pulse:  87 97 92  Resp:  18 18 18   Temp:  98.5 F (36.9 C)  (!) 97.5 F (36.4 C)  TempSrc:  Oral  Oral  SpO2: 97% 93% 93% 99%  Weight:      Height:        General: Pt is alert, awake, not in acute distress Cardiovascular: RRR, nl S1-S2, no murmurs appreciated.   No LE edema.   Respiratory: Normal respiratory rate and rhythm.  CTAB without rales or wheezes. Abdominal: Abdomen soft and non-tender.  No distension or HSM.   Neuro/Psych: Strength symmetric in upper and lower extremities.  Judgment and insight appear normal.   The results of significant diagnostics from this hospitalization (including imaging, microbiology, ancillary and laboratory) are listed below for reference.     Microbiology: Recent Results (from the past 240 hour(s))  Resp Panel by RT PCR (RSV, Flu A&B, Covid) - Nasopharyngeal Swab     Status: None   Collection Time: 06/10/20  2:42 PM   Specimen: Nasopharyngeal Swab  Result Value Ref Range Status   SARS Coronavirus 2 by RT PCR NEGATIVE NEGATIVE Final    Comment: (NOTE) SARS-CoV-2 target nucleic acids are NOT DETECTED.  The SARS-CoV-2 RNA is generally detectable in upper respiratoy specimens during the acute phase of infection. The lowest concentration of SARS-CoV-2 viral copies this assay can detect is 131 copies/mL. A negative result does not preclude SARS-Cov-2 infection and should not be used as the sole basis for treatment or other patient management decisions. A negative result may occur with  improper specimen collection/handling, submission of specimen other than nasopharyngeal swab, presence of viral mutation(s) within the areas targeted by this assay, and inadequate number of viral copies (<131 copies/mL). A negative result must be combined with clinical observations, patient history, and epidemiological information. The expected result is Negative.  Fact Sheet for Patients:   PinkCheek.be  Fact Sheet for Healthcare Providers:  GravelBags.it  This test is no t yet approved or cleared by the [place] FDA and  has been authorized for detection and/or diagnosis of SARS-CoV-2 by FDA under an Emergency Use Authorization (EUA). This EUA will remain  in effect (meaning this test can be used) for the duration of the COVID-19 declaration under Section 564(b)(1) of the Act, 21 U.S.C. section 360bbb-3(b)(1), unless the  authorization is terminated or revoked sooner.     Influenza A by PCR NEGATIVE NEGATIVE Final   Influenza B by PCR NEGATIVE NEGATIVE Final    Comment: (NOTE) The Xpert Xpress SARS-CoV-2/FLU/RSV assay is intended as an aid in  the diagnosis of influenza from Nasopharyngeal swab specimens and  should not be used as a sole basis for treatment. Nasal washings and  aspirates are unacceptable for Xpert Xpress SARS-CoV-2/FLU/RSV  testing.  Fact Sheet for Patients: PinkCheek.be  Fact Sheet for Healthcare Providers: GravelBags.it  This test is not yet approved or cleared by the [place] FDA and  has been authorized for detection and/or diagnosis of SARS-CoV-2 by  FDA under an Emergency Use Authorization (EUA). This EUA will remain  in effect (meaning this test can be used) for the duration of the  Covid-19 declaration under Section 564(b)(1) of the Act, 21  U.S.C. section 360bbb-3(b)(1), unless the authorization is  terminated or revoked.    Respiratory Syncytial Virus by PCR NEGATIVE NEGATIVE Final    Comment: (NOTE) Fact Sheet for Patients: PinkCheek.be  Fact Sheet for Healthcare Providers: GravelBags.it  This test is not yet approved or cleared by the [place] FDA and  has been authorized for detection and/or diagnosis of SARS-CoV-2 by  FDA under an Emergency  Use Authorization (EUA). This EUA will remain  in effect (meaning this test can be used) for the duration of the  COVID-19 declaration under Section 564(b)(1) of the Act, 21 U.S.C.  section 360bbb-3(b)(1), unless the authorization is terminated or  revoked. Performed at [facility], [place] [address], [place], [place] [address]      Labs: BNP (last 3 results) No results for input(s): BNP in the last 8760 hours. Basic Metabolic Panel: Recent Labs  Lab 06/10/20 0951 06/10/20 0957 06/10/20 1338 06/10/20 1341 06/11/20 0401  NA 140 141  --  142 140  K 3.3* 3.2*  --  2.8* 2.4*  CL 100 98  --   --  103  CO2 26  --   --   --  27  GLUCOSE 103* 98  --   --  106*  BUN 13 15  --   --  12  CREATININE 0.65 0.50  --   --  0.71  CALCIUM 9.4  --   --   --  8.5*  MG  --   --  1.6*  --  1.7   Liver Function Tests: Recent Labs  Lab 06/10/20 0951  AST 72*  ALT 39  ALKPHOS 101  BILITOT 1.0  PROT 6.4*  ALBUMIN 3.3*   No results for input(s): LIPASE, AMYLASE in the last 168 hours. No results for input(s): AMMONIA in the last 168 hours. CBC: Recent Labs  Lab 06/10/20 0951 06/10/20 0957 06/10/20 1341 06/11/20 0401  WBC 10.7*  --   --  5.8  HGB 14.3 15.3* 13.6 11.9*  HCT 44.3 45.0 40.0 36.4  MCV 90.8  --   --  90.3  PLT 307  --   --  248   Cardiac Enzymes: Recent Labs  Lab 06/10/20 0951 06/10/20 1556 06/11/20 0401  CKTOTAL 6,898* 4,378* 2,141*   BNP: Invalid input(s): POCBNP CBG: No results for input(s): GLUCAP in the last 168 hours. D-Dimer No results for input(s): DDIMER in the last 72 hours. Hgb A1c Recent Labs    06/10/20 1556  HGBA1C 5.8*   Lipid Profile No results for input(s): CHOL, HDL, LDLCALC, TRIG, CHOLHDL, LDLDIRECT in the last  72 hours. Thyroid function studies Recent Labs    06/10/20 1556  TSH 0.295*   Anemia work up No results for input(s): VITAMINB12, FOLATE, FERRITIN, TIBC, IRON, RETICCTPCT in the last 72 hours. Urinalysis     Component Value Date/Time   COLORURINE YELLOW 06/10/2020 1715   APPEARANCEUR CLOUDY (A) 06/10/2020 1715   APPEARANCEUR Cloudy 03/29/2012 1537   LABSPEC >1.046 (H) 06/10/2020 1715   LABSPEC 1.042 03/29/2012 1537   PHURINE 8.0 06/10/2020 1715   GLUCOSEU NEGATIVE 06/10/2020 1715   GLUCOSEU Negative 03/29/2012 1537   HGBUR SMALL (A) 06/10/2020 1715   BILIRUBINUR NEGATIVE 06/10/2020 1715   BILIRUBINUR Negative 03/29/2012 1537   KETONESUR 5 (A) 06/10/2020 1715   PROTEINUR 30 (A) 06/10/2020 1715   NITRITE POSITIVE (A) 06/10/2020 1715   LEUKOCYTESUR LARGE (A) 06/10/2020 1715   LEUKOCYTESUR Negative 03/29/2012 1537   Sepsis Labs Invalid input(s): PROCALCITONIN,  WBC,  LACTICIDVEN Microbiology Recent Results (from the past 240 hour(s))  Resp Panel by RT PCR (RSV, Flu A&B, Covid) - Nasopharyngeal Swab     Status: None   Collection Time: 06/10/20  2:42 PM   Specimen: Nasopharyngeal Swab  Result Value Ref Range Status   SARS Coronavirus 2 by RT PCR NEGATIVE NEGATIVE Final    Comment: (NOTE) SARS-CoV-2 target nucleic acids are NOT DETECTED.  The SARS-CoV-2 RNA is generally detectable in upper respiratoy specimens during the acute phase of infection. The lowest concentration of SARS-CoV-2 viral copies this assay can detect is 131 copies/mL. A negative result does not preclude SARS-Cov-2 infection and should not be used as the sole basis for treatment or other patient management decisions. A negative result may occur with  improper specimen collection/handling, submission of specimen other than nasopharyngeal swab, presence of viral mutation(s) within the areas targeted by this assay, and inadequate number of viral copies (<131 copies/mL). A negative result must be combined with clinical observations, patient history, and epidemiological information. The expected result is Negative.  Fact Sheet for Patients:  PinkCheek.be  Fact Sheet for Healthcare  Providers:  GravelBags.it  This test is no t yet approved or cleared by the [place] FDA and  has been authorized for detection and/or diagnosis of SARS-CoV-2 by FDA under an Emergency Use Authorization (EUA). This EUA will remain  in effect (meaning this test can be used) for the duration of the COVID-19 declaration under Section 564(b)(1) of the Act, 21 U.S.C. section 360bbb-3(b)(1), unless the authorization is terminated or revoked sooner.     Influenza A by PCR NEGATIVE NEGATIVE Final   Influenza B by PCR NEGATIVE NEGATIVE Final    Comment: (NOTE) The Xpert Xpress SARS-CoV-2/FLU/RSV assay is intended as an aid in  the diagnosis of influenza from Nasopharyngeal swab specimens and  should not be used as a sole basis for treatment. Nasal washings and  aspirates are unacceptable for Xpert Xpress SARS-CoV-2/FLU/RSV  testing.  Fact Sheet for Patients: PinkCheek.be  Fact Sheet for Healthcare Providers: GravelBags.it  This test is not yet approved or cleared by the [place] FDA and  has been authorized for detection and/or diagnosis of SARS-CoV-2 by  FDA under an Emergency Use Authorization (EUA). This EUA will remain  in effect (meaning this test can be used) for the duration of the  Covid-19 declaration under Section 564(b)(1) of the Act, 21  U.S.C. section 360bbb-3(b)(1), unless the authorization is  terminated or revoked.    Respiratory Syncytial Virus by PCR NEGATIVE NEGATIVE Final    Comment: (NOTE) Fact Sheet for  Patients: PinkCheek.be  Fact Sheet for Healthcare Providers: GravelBags.it  This test is not yet approved or cleared by the [place] FDA and  has been authorized for detection and/or diagnosis of SARS-CoV-2 by  FDA under an Emergency Use Authorization (EUA). This EUA will remain  in effect (meaning this  test can be used) for the duration of the  COVID-19 declaration under Section 564(b)(1) of the Act, 21 U.S.C.  section 360bbb-3(b)(1), unless the authorization is terminated or  revoked. Performed at [facility], [place] [address] [place], [place] [address]      Time coordinating discharge: 25 minutes The [place] controlled substances registry was reviewed for this patient prior to filling the <5 days supply controlled substances script.      SIGNED:   Edwin Dada, MD  [facility] 06/11/2020, 5:17 PM
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
## 2020-06-11 NOTE — Progress Notes (Deleted)
Physician Discharge Summary  ALAIJA RUBLE JJK:[id number] DOB: 04/06/1966 DOA: 06/10/2020  PCP: Dion Body, MD  Admit date: 06/10/2020 Discharge date: 06/11/2020  Admitted From: Home  Disposition:  Home   Recommendations for Outpatient Follow-up:  1. Follow up with PCP in 1-2 weeks 2. Dr. Netty Starring: Please obtain repeat K level in 1 week     Home Health: None  Equipment/Devices: None  Discharge Condition: Good  CODE STATUS: FULL Diet recommendation: Regular  Brief/Interim Summary: Mrs. Olivar is a 54 y.o. 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  Bystanders called EMS for patient had fallen down stairs.  Last seen at 5 AM, found around 9 AM at bottom of stairs, belongings at top of stairs, about 10 steps.  Patient with headache, left sided bruising, scrapes. She was unable to remember any details of fall, injury.        PRINCIPAL HOSPITAL DIAGNOSIS: Fall and concussion    Discharge Diagnoses:   Fall Patient with opiates and benzodiazepines in UDS on admission, denied use.  Ethanol negative.  Endorsed 1-2 drink alcohol use.    On work up here, x-rays of left arm, CT of head, chest abdomen and pelvis were unremarkable, no injuries noted.  Patient observed overnight and was at baseline other than pain from fall/bruising.   Concussion Patient's headache and amnesia suggest a concussion.  Anticipatory guidance suggested.  No further treatment at this time.  Hypokalemia Potassium down to 2.4 mmol/L here.  Supplemented K and discharged with oral K and close PCP follow up.      Discharge Instructions  Discharge Instructions    Diet - low sodium heart healthy   Complete by: As directed    Discharge instructions   Complete by: As directed    You were admitted for a fall and likely a concussion.  You should rest and slowly resume your normal activities.  No particular precautions  are needed for the concussion.  Some additional information is included below.  For the bruising, I would recommend primarily that you take: Ibuprofen 600 mg (three tabs) three times daily for the next week If you have additional pain, take low dose tramadol in addition.  For the low potassium: Take potassium 20 mEq once nightly for the next 2 weeks Call your primary care doctor to have your potassium rechecked   Increase activity slowly   Complete by: As directed      Allergies as of 06/11/2020      Reactions   Penicillins Hives, Swelling, Other (See Comments)   Has patient had a PCN reaction causing immediate rash, facial/tongue/throat swelling, SOB or lightheadedness with hypotension: No Has patient had a PCN reaction causing severe rash involving mucus membranes or skin necrosis: No Has patient had a PCN reaction that required hospitalization No Has patient had a PCN reaction occurring within the last 10 years: No If all of the above answers are "NO", then may proceed with Cephalosporin use. Other reaction(s): SWELLING   Alcohol    Other reaction(s): Other (See Comments) Cannot drink alcohol, it triggers migraines.   Codeine Itching   Tramadol    Other reaction(s): Headache, Other (See Comments)  headache      Medication List    STOP taking these medications   aspirin 325 MG tablet     TAKE these medications   PARoxetine 30 MG tablet Commonly known as: PAXIL Take 30 mg by mouth at  bedtime.   Potassium Chloride ER 20 MEQ Tbcr Take 20 mEq by mouth at bedtime for 14 days.   traMADol 50 MG tablet Commonly known as: ULTRAM Take 2 tablets (100 mg total) by mouth every 12 (twelve) hours as needed. What changed:   when to take this  reasons to take this       Allergies  Allergen Reactions  . Penicillins Hives, Swelling and Other (See Comments)    Has patient had a PCN reaction causing immediate rash, facial/tongue/throat swelling, SOB or lightheadedness with  hypotension: No Has patient had a PCN reaction causing severe rash involving mucus membranes or skin necrosis: No Has patient had a PCN reaction that required hospitalization No Has patient had a PCN reaction occurring within the last 10 years: No If all of the above answers are "NO", then may proceed with Cephalosporin use.  Other reaction(s): SWELLING   . Alcohol     Other reaction(s): Other (See Comments) Cannot drink alcohol, it triggers migraines.  . Codeine Itching  . Tramadol     Other reaction(s): Headache, Other (See Comments)  headache     Consultations:  None   Procedures/Studies: DG Wrist Complete Left  Result Date: 06/10/2020 CLINICAL DATA:  Status post fall down stairs. EXAM: LEFT WRIST - COMPLETE 3+ VIEW COMPARISON:  None. FINDINGS: There is no evidence of fracture or dislocation. There is no evidence of arthropathy or other focal bone abnormality. Soft tissues are unremarkable. IMPRESSION: Negative. Electronically Signed   By: Fidela Salisbury M.D.   On: 06/10/2020 10:06   CT HEAD WO CONTRAST  Result Date: 06/10/2020 CLINICAL DATA:  Fall EXAM: CT HEAD WITHOUT CONTRAST CT MAXILLOFACIAL WITHOUT CONTRAST CT CERVICAL SPINE WITHOUT CONTRAST TECHNIQUE: Multidetector CT imaging of the head, cervical spine, and maxillofacial structures were performed using the standard protocol without intravenous contrast. Multiplanar CT image reconstructions of the cervical spine and maxillofacial structures were also generated. COMPARISON:  CT cervical spine 2019 FINDINGS: CT HEAD FINDINGS Brain: There is no acute intracranial hemorrhage, mass effect, or edema. Gray-white differentiation is preserved. Ventricles and sulci are size and configuration. There is no extra-axial collection. Vascular: Unremarkable Skull: Intact. Other: Minimal passive opacification of the mastoid air cells. CT MAXILLOFACIAL FINDINGS Osseous: No acute facial fracture. Orbits: No intraorbital hematoma. Sinuses:  Evidence of prior sinonasal surgery. Mild polypoid mucosal thickening. Soft tissues: Negative. CT CERVICAL SPINE FINDINGS Alignment: Preserved. Skull base and vertebrae: Interbody fusion device at C5-C6 with associated streak artifact. Vertebral body heights are maintained. There is no acute fracture. Soft tissues and spinal canal: No prevertebral fluid or swelling. No visible canal hematoma. Disc levels: Mild multilevel degenerative changes. Appearance is similar to the prior study. Upper chest: Negative. Other: None. IMPRESSION: No evidence of acute intracranial injury, acute facial fracture, or acute cervical spine fracture. Electronically Signed   By: Macy Mis M.D.   On: 06/10/2020 11:07   CT CHEST W CONTRAST  Result Date: 06/10/2020 CLINICAL DATA:  Abdominal trauma.  Status post fall. EXAM: CT CHEST, ABDOMEN, AND PELVIS WITH CONTRAST TECHNIQUE: Multidetector CT imaging of the chest, abdomen and pelvis was performed following the standard protocol during bolus administration of intravenous contrast. CONTRAST:  170mL OMNIPAQUE IOHEXOL 300 MG/ML  SOLN COMPARISON:  August 22, 2019 FINDINGS: CT CHEST FINDINGS Cardiovascular: No significant vascular findings. Normal heart size. No pericardial effusion. Mediastinum/Nodes: No enlarged mediastinal, hilar, or axillary lymph nodes. Thyroid gland, trachea, and esophagus demonstrate no significant findings. Lungs/Pleura: Right middle lobe atelectasis versus scarring. Mild  subpleural interstitial changes in both lungs. Musculoskeletal: No chest wall mass or suspicious bone lesions identified. CT ABDOMEN PELVIS FINDINGS Hepatobiliary: Hepatic steatosis. Post cholecystectomy. No biliary ductal dilation. Pancreas: Somewhat atrophic appearance of the pancreas. No focal masses. Spleen: Normal in size without focal abnormality. Adrenals/Urinary Tract: Adrenal glands are unremarkable. Kidneys are normal, without renal calculi, focal lesion, or hydronephrosis. Bladder is  unremarkable. Stomach/Bowel: Stomach is within normal limits. No evidence of appendicitis. No evidence of bowel wall thickening, distention, or inflammatory changes. Vascular/Lymphatic: Aortic atherosclerosis. No enlarged abdominal or pelvic lymph nodes. Reproductive: Posterior uterine fibroid noted.  No adnexal masses. Other: No abdominal wall hernia or abnormality. No abdominopelvic ascites. Musculoskeletal: No fracture is seen. L5-S1 posterior fusion with intact hardware. IMPRESSION: 1. No evidence of acute traumatic injury to the chest, abdomen or pelvis. 2. Hepatic steatosis. 3. Posterior uterine fibroid. 4. Right middle lobe atelectasis versus scarring. 5. Mild subpleural interstitial changes in both lungs. Aortic Atherosclerosis (ICD10-I70.0). Electronically Signed   By: Fidela Salisbury M.D.   On: 06/10/2020 11:07   CT CERVICAL SPINE WO CONTRAST  Result Date: 06/10/2020 CLINICAL DATA:  Fall EXAM: CT HEAD WITHOUT CONTRAST CT MAXILLOFACIAL WITHOUT CONTRAST CT CERVICAL SPINE WITHOUT CONTRAST TECHNIQUE: Multidetector CT imaging of the head, cervical spine, and maxillofacial structures were performed using the standard protocol without intravenous contrast. Multiplanar CT image reconstructions of the cervical spine and maxillofacial structures were also generated. COMPARISON:  CT cervical spine 2019 FINDINGS: CT HEAD FINDINGS Brain: There is no acute intracranial hemorrhage, mass effect, or edema. Gray-white differentiation is preserved. Ventricles and sulci are size and configuration. There is no extra-axial collection. Vascular: Unremarkable Skull: Intact. Other: Minimal passive opacification of the mastoid air cells. CT MAXILLOFACIAL FINDINGS Osseous: No acute facial fracture. Orbits: No intraorbital hematoma. Sinuses: Evidence of prior sinonasal surgery. Mild polypoid mucosal thickening. Soft tissues: Negative. CT CERVICAL SPINE FINDINGS Alignment: Preserved. Skull base and vertebrae: Interbody fusion  device at C5-C6 with associated streak artifact. Vertebral body heights are maintained. There is no acute fracture. Soft tissues and spinal canal: No prevertebral fluid or swelling. No visible canal hematoma. Disc levels: Mild multilevel degenerative changes. Appearance is similar to the prior study. Upper chest: Negative. Other: None. IMPRESSION: No evidence of acute intracranial injury, acute facial fracture, or acute cervical spine fracture. Electronically Signed   By: Macy Mis M.D.   On: 06/10/2020 11:07   CT ABDOMEN PELVIS W CONTRAST  Result Date: 06/10/2020 CLINICAL DATA:  Abdominal trauma.  Status post fall. EXAM: CT CHEST, ABDOMEN, AND PELVIS WITH CONTRAST TECHNIQUE: Multidetector CT imaging of the chest, abdomen and pelvis was performed following the standard protocol during bolus administration of intravenous contrast. CONTRAST:  133mL OMNIPAQUE IOHEXOL 300 MG/ML  SOLN COMPARISON:  August 22, 2019 FINDINGS: CT CHEST FINDINGS Cardiovascular: No significant vascular findings. Normal heart size. No pericardial effusion. Mediastinum/Nodes: No enlarged mediastinal, hilar, or axillary lymph nodes. Thyroid gland, trachea, and esophagus demonstrate no significant findings. Lungs/Pleura: Right middle lobe atelectasis versus scarring. Mild subpleural interstitial changes in both lungs. Musculoskeletal: No chest wall mass or suspicious bone lesions identified. CT ABDOMEN PELVIS FINDINGS Hepatobiliary: Hepatic steatosis. Post cholecystectomy. No biliary ductal dilation. Pancreas: Somewhat atrophic appearance of the pancreas. No focal masses. Spleen: Normal in size without focal abnormality. Adrenals/Urinary Tract: Adrenal glands are unremarkable. Kidneys are normal, without renal calculi, focal lesion, or hydronephrosis. Bladder is unremarkable. Stomach/Bowel: Stomach is within normal limits. No evidence of appendicitis. No evidence of bowel wall thickening, distention, or inflammatory changes.  Vascular/Lymphatic: Aortic atherosclerosis. No enlarged abdominal or pelvic lymph nodes. Reproductive: Posterior uterine fibroid noted.  No adnexal masses. Other: No abdominal wall hernia or abnormality. No abdominopelvic ascites. Musculoskeletal: No fracture is seen. L5-S1 posterior fusion with intact hardware. IMPRESSION: 1. No evidence of acute traumatic injury to the chest, abdomen or pelvis. 2. Hepatic steatosis. 3. Posterior uterine fibroid. 4. Right middle lobe atelectasis versus scarring. 5. Mild subpleural interstitial changes in both lungs. Aortic Atherosclerosis (ICD10-I70.0). Electronically Signed   By: Fidela Salisbury M.D.   On: 06/10/2020 11:07   DG Pelvis Portable  Result Date: 06/10/2020 CLINICAL DATA:  Status post fall down stairs. EXAM: PORTABLE PELVIS 1-2 VIEWS COMPARISON:  None. FINDINGS: There is no evidence of pelvic fracture or diastasis. No pelvic bone lesions are seen. Prior L5-S1 fusion. IMPRESSION: Negative. Electronically Signed   By: Fidela Salisbury M.D.   On: 06/10/2020 09:56   DG Chest Port 1 View  Result Date: 06/10/2020 CLINICAL DATA:  Status post fall down stairs. EXAM: PORTABLE CHEST 1 VIEW COMPARISON:  January 30, 2018 FINDINGS: Cardiomediastinal silhouette is normal. Mediastinal contours appear intact. There is no evidence of focal airspace consolidation, pleural effusion or pneumothorax. Osseous structures are without acute abnormality. Soft tissues are grossly normal. IMPRESSION: No active disease. Electronically Signed   By: Fidela Salisbury M.D.   On: 06/10/2020 09:55   DG Hand Complete Left  Result Date: 06/10/2020 CLINICAL DATA:  Status post fall down stairs. EXAM: LEFT HAND - COMPLETE 3+ VIEW COMPARISON:  None. FINDINGS: There is no evidence of fracture or dislocation. There is no evidence of arthropathy or other focal bone abnormality. Soft tissues are unremarkable. IMPRESSION: Negative. Electronically Signed   By: Fidela Salisbury M.D.   On:  06/10/2020 09:57   ECHOCARDIOGRAM COMPLETE  Result Date: 06/11/2020    ECHOCARDIOGRAM REPORT   Patient Name:   ALETTE KATAOKA Date of Exam: 06/11/2020 Medical Rec #:  [id number]        Height:       66.0 in Accession #:    [id number]       Weight:       130.1 lb Date of Birth:  1965-09-05         BSA:          1.666 m Patient Age:    54 years         BP:           105/80 mmHg Patient Gender: F                HR:           83 bpm. Exam Location:  Inpatient Procedure: 2D Echo, Cardiac Doppler and Color Doppler Indications:    R94.31 Abnormal EKG  History:        Patient has no prior history of Echocardiogram examinations.  Sonographer:    Bernadene Person RDCS Referring Phys: [id number] [place]  1. Left ventricular ejection fraction, by estimation, is 60 to 65%. The left ventricle has normal function. The left ventricle has no regional wall motion abnormalities. There is mild concentric left ventricular hypertrophy. Left ventricular diastolic parameters are consistent with Grade I diastolic dysfunction (impaired relaxation).  2. Right ventricular systolic function is normal. The right ventricular size is normal.  3. The mitral valve is normal in structure. Trivial mitral valve regurgitation.  4. The aortic valve is tricuspid. There is mild thickening of the aortic valve. Aortic valve regurgitation is not visualized.  5. The inferior vena cava is normal in size with greater than 50% respiratory variability, suggesting right atrial pressure of 3 mmHg. Comparison(s): No prior Echocardiogram. FINDINGS  Left Ventricle: Left ventricular ejection fraction, by estimation, is 60 to 65%. The left ventricle has normal function. The left ventricle has no regional wall motion abnormalities. The left ventricular internal cavity size was normal in size. There is  mild concentric left ventricular hypertrophy. Left ventricular diastolic parameters are consistent with Grade I diastolic dysfunction (impaired  relaxation). Right Ventricle: The right ventricular size is normal. No increase in right ventricular wall thickness. Right ventricular systolic function is normal. Left Atrium: Left atrial size was normal in size. Right Atrium: Right atrial size was normal in size. Pericardium: There is no evidence of pericardial effusion. Mitral Valve: The mitral valve is normal in structure. There is mild thickening of the mitral valve leaflet(s). Trivial mitral valve regurgitation. Tricuspid Valve: The tricuspid valve is normal in structure. Tricuspid valve regurgitation is trivial. Aortic Valve: The aortic valve is tricuspid. There is mild thickening of the aortic valve. Aortic valve regurgitation is not visualized. Pulmonic Valve: The pulmonic valve was normal in structure. Pulmonic valve regurgitation is trivial. Aorta: The aortic root and ascending aorta are structurally normal, with no evidence of dilitation. Venous: The inferior vena cava is normal in size with greater than 50% respiratory variability, suggesting right atrial pressure of 3 mmHg. IAS/Shunts: No atrial level shunt detected by color flow Doppler.  LEFT VENTRICLE PLAX 2D LVIDd:         3.30 cm LVIDs:         1.90 cm LV PW:         1.20 cm LV IVS:        1.20 cm LVOT diam:     2.00 cm LV SV:         79 LV SV Index:   47 LVOT Area:     3.14 cm  RIGHT VENTRICLE TAPSE (M-mode): 1.8 cm LEFT ATRIUM             Index       RIGHT ATRIUM           Index LA diam:        2.50 cm 1.50 cm/m  RA Area:     12.10 cm LA Vol (A2C):   38.3 ml 22.99 ml/m RA Volume:   27.00 ml  16.21 ml/m LA Vol (A4C):   47.0 ml 28.22 ml/m LA Biplane Vol: 43.1 ml 25.88 ml/m  AORTIC VALVE LVOT Vmax:   123.67 cm/s LVOT Vmean:  86.067 cm/s LVOT VTI:    0.251 m  AORTA Ao Root diam: 3.40 cm Ao Asc diam:  3.50 cm  SHUNTS Systemic VTI:  0.25 m Systemic Diam: 2.00 cm Gwyndolyn Kaufman MD Electronically signed by Gwyndolyn Kaufman MD Signature Date/Time: 06/11/2020/10:56:02 AM    Final    CT  MAXILLOFACIAL WO CONTRAST  Result Date: 06/10/2020 CLINICAL DATA:  Fall EXAM: CT HEAD WITHOUT CONTRAST CT MAXILLOFACIAL WITHOUT CONTRAST CT CERVICAL SPINE WITHOUT CONTRAST TECHNIQUE: Multidetector CT imaging of the head, cervical spine, and maxillofacial structures were performed using the standard protocol without intravenous contrast. Multiplanar CT image reconstructions of the cervical spine and maxillofacial structures were also generated. COMPARISON:  CT cervical spine 2019 FINDINGS: CT HEAD FINDINGS Brain: There is no acute intracranial hemorrhage, mass effect, or edema. Gray-white differentiation is preserved. Ventricles and sulci are size and configuration. There is no extra-axial collection. Vascular: Unremarkable Skull: Intact.  Other: Minimal passive opacification of the mastoid air cells. CT MAXILLOFACIAL FINDINGS Osseous: No acute facial fracture. Orbits: No intraorbital hematoma. Sinuses: Evidence of prior sinonasal surgery. Mild polypoid mucosal thickening. Soft tissues: Negative. CT CERVICAL SPINE FINDINGS Alignment: Preserved. Skull base and vertebrae: Interbody fusion device at C5-C6 with associated streak artifact. Vertebral body heights are maintained. There is no acute fracture. Soft tissues and spinal canal: No prevertebral fluid or swelling. No visible canal hematoma. Disc levels: Mild multilevel degenerative changes. Appearance is similar to the prior study. Upper chest: Negative. Other: None. IMPRESSION: No evidence of acute intracranial injury, acute facial fracture, or acute cervical spine fracture. Electronically Signed   By: Macy Mis M.D.   On: 06/10/2020 11:07       Subjective: Patient sore all over. Headache noted.  No confusion.  No focal weakness.  No dizziness.  Discharge Exam: Vitals:   06/11/20 0720 06/11/20 1409  BP:  110/80  Pulse: 97 92  Resp: 18 18  Temp:  (!) 97.5 F (36.4 C)  SpO2: 93% 99%   Vitals:   06/11/20 0205 06/11/20 0505 06/11/20 0720  06/11/20 1409  BP:  105/80  110/80  Pulse:  87 97 92  Resp:  18 18 18   Temp:  98.5 F (36.9 C)  (!) 97.5 F (36.4 C)  TempSrc:  Oral  Oral  SpO2: 97% 93% 93% 99%  Weight:      Height:        General: Pt is alert, awake, not in acute distress Cardiovascular: RRR, nl S1-S2, no murmurs appreciated.   No LE edema.   Respiratory: Normal respiratory rate and rhythm.  CTAB without rales or wheezes. Abdominal: Abdomen soft and non-tender.  No distension or HSM.   Neuro/Psych: Strength symmetric in upper and lower extremities.  Judgment and insight appear normal.   The results of significant diagnostics from this hospitalization (including imaging, microbiology, ancillary and laboratory) are listed below for reference.     Microbiology: Recent Results (from the past 240 hour(s))  Resp Panel by RT PCR (RSV, Flu A&B, Covid) - Nasopharyngeal Swab     Status: None   Collection Time: 06/10/20  2:42 PM   Specimen: Nasopharyngeal Swab  Result Value Ref Range Status   SARS Coronavirus 2 by RT PCR NEGATIVE NEGATIVE Final    Comment: (NOTE) SARS-CoV-2 target nucleic acids are NOT DETECTED.  The SARS-CoV-2 RNA is generally detectable in upper respiratoy specimens during the acute phase of infection. The lowest concentration of SARS-CoV-2 viral copies this assay can detect is 131 copies/mL. A negative result does not preclude SARS-Cov-2 infection and should not be used as the sole basis for treatment or other patient management decisions. A negative result may occur with  improper specimen collection/handling, submission of specimen other than nasopharyngeal swab, presence of viral mutation(s) within the areas targeted by this assay, and inadequate number of viral copies (<131 copies/mL). A negative result must be combined with clinical observations, patient history, and epidemiological information. The expected result is Negative.  Fact Sheet for Patients:   PinkCheek.be  Fact Sheet for Healthcare Providers:  GravelBags.it  This test is no t yet approved or cleared by the [place] FDA and  has been authorized for detection and/or diagnosis of SARS-CoV-2 by FDA under an Emergency Use Authorization (EUA). This EUA will remain  in effect (meaning this test can be used) for the duration of the COVID-19 declaration under Section 564(b)(1) of the Act, 21 U.S.C. section 360bbb-3(b)(1), unless the  authorization is terminated or revoked sooner.     Influenza A by PCR NEGATIVE NEGATIVE Final   Influenza B by PCR NEGATIVE NEGATIVE Final    Comment: (NOTE) The Xpert Xpress SARS-CoV-2/FLU/RSV assay is intended as an aid in  the diagnosis of influenza from Nasopharyngeal swab specimens and  should not be used as a sole basis for treatment. Nasal washings and  aspirates are unacceptable for Xpert Xpress SARS-CoV-2/FLU/RSV  testing.  Fact Sheet for Patients: PinkCheek.be  Fact Sheet for Healthcare Providers: GravelBags.it  This test is not yet approved or cleared by the [place] FDA and  has been authorized for detection and/or diagnosis of SARS-CoV-2 by  FDA under an Emergency Use Authorization (EUA). This EUA will remain  in effect (meaning this test can be used) for the duration of the  Covid-19 declaration under Section 564(b)(1) of the Act, 21  U.S.C. section 360bbb-3(b)(1), unless the authorization is  terminated or revoked.    Respiratory Syncytial Virus by PCR NEGATIVE NEGATIVE Final    Comment: (NOTE) Fact Sheet for Patients: PinkCheek.be  Fact Sheet for Healthcare Providers: GravelBags.it  This test is not yet approved or cleared by the [place] FDA and  has been authorized for detection and/or diagnosis of SARS-CoV-2 by  FDA under an Emergency  Use Authorization (EUA). This EUA will remain  in effect (meaning this test can be used) for the duration of the  COVID-19 declaration under Section 564(b)(1) of the Act, 21 U.S.C.  section 360bbb-3(b)(1), unless the authorization is terminated or  revoked. Performed at [facility], [place] [address], [place], [place] [address]      Labs: BNP (last 3 results) No results for input(s): BNP in the last 8760 hours. Basic Metabolic Panel: Recent Labs  Lab 06/10/20 0951 06/10/20 0957 06/10/20 1338 06/10/20 1341 06/11/20 0401  NA 140 141  --  142 140  K 3.3* 3.2*  --  2.8* 2.4*  CL 100 98  --   --  103  CO2 26  --   --   --  27  GLUCOSE 103* 98  --   --  106*  BUN 13 15  --   --  12  CREATININE 0.65 0.50  --   --  0.71  CALCIUM 9.4  --   --   --  8.5*  MG  --   --  1.6*  --  1.7   Liver Function Tests: Recent Labs  Lab 06/10/20 0951  AST 72*  ALT 39  ALKPHOS 101  BILITOT 1.0  PROT 6.4*  ALBUMIN 3.3*   No results for input(s): LIPASE, AMYLASE in the last 168 hours. No results for input(s): AMMONIA in the last 168 hours. CBC: Recent Labs  Lab 06/10/20 0951 06/10/20 0957 06/10/20 1341 06/11/20 0401  WBC 10.7*  --   --  5.8  HGB 14.3 15.3* 13.6 11.9*  HCT 44.3 45.0 40.0 36.4  MCV 90.8  --   --  90.3  PLT 307  --   --  248   Cardiac Enzymes: Recent Labs  Lab 06/10/20 0951 06/10/20 1556 06/11/20 0401  CKTOTAL 6,898* 4,378* 2,141*   BNP: Invalid input(s): POCBNP CBG: No results for input(s): GLUCAP in the last 168 hours. D-Dimer No results for input(s): DDIMER in the last 72 hours. Hgb A1c Recent Labs    06/10/20 1556  HGBA1C 5.8*   Lipid Profile No results for input(s): CHOL, HDL, LDLCALC, TRIG, CHOLHDL, LDLDIRECT in the last  72 hours. Thyroid function studies Recent Labs    06/10/20 1556  TSH 0.295*   Anemia work up No results for input(s): VITAMINB12, FOLATE, FERRITIN, TIBC, IRON, RETICCTPCT in the last 72 hours. Urinalysis     Component Value Date/Time   COLORURINE YELLOW 06/10/2020 1715   APPEARANCEUR CLOUDY (A) 06/10/2020 1715   APPEARANCEUR Cloudy 03/29/2012 1537   LABSPEC >1.046 (H) 06/10/2020 1715   LABSPEC 1.042 03/29/2012 1537   PHURINE 8.0 06/10/2020 1715   GLUCOSEU NEGATIVE 06/10/2020 1715   GLUCOSEU Negative 03/29/2012 1537   HGBUR SMALL (A) 06/10/2020 1715   BILIRUBINUR NEGATIVE 06/10/2020 1715   BILIRUBINUR Negative 03/29/2012 1537   KETONESUR 5 (A) 06/10/2020 1715   PROTEINUR 30 (A) 06/10/2020 1715   NITRITE POSITIVE (A) 06/10/2020 1715   LEUKOCYTESUR LARGE (A) 06/10/2020 1715   LEUKOCYTESUR Negative 03/29/2012 1537   Sepsis Labs Invalid input(s): PROCALCITONIN,  WBC,  LACTICIDVEN Microbiology Recent Results (from the past 240 hour(s))  Resp Panel by RT PCR (RSV, Flu A&B, Covid) - Nasopharyngeal Swab     Status: None   Collection Time: 06/10/20  2:42 PM   Specimen: Nasopharyngeal Swab  Result Value Ref Range Status   SARS Coronavirus 2 by RT PCR NEGATIVE NEGATIVE Final    Comment: (NOTE) SARS-CoV-2 target nucleic acids are NOT DETECTED.  The SARS-CoV-2 RNA is generally detectable in upper respiratoy specimens during the acute phase of infection. The lowest concentration of SARS-CoV-2 viral copies this assay can detect is 131 copies/mL. A negative result does not preclude SARS-Cov-2 infection and should not be used as the sole basis for treatment or other patient management decisions. A negative result may occur with  improper specimen collection/handling, submission of specimen other than nasopharyngeal swab, presence of viral mutation(s) within the areas targeted by this assay, and inadequate number of viral copies (<131 copies/mL). A negative result must be combined with clinical observations, patient history, and epidemiological information. The expected result is Negative.  Fact Sheet for Patients:  PinkCheek.be  Fact Sheet for Healthcare  Providers:  GravelBags.it  This test is no t yet approved or cleared by the [place] FDA and  has been authorized for detection and/or diagnosis of SARS-CoV-2 by FDA under an Emergency Use Authorization (EUA). This EUA will remain  in effect (meaning this test can be used) for the duration of the COVID-19 declaration under Section 564(b)(1) of the Act, 21 U.S.C. section 360bbb-3(b)(1), unless the authorization is terminated or revoked sooner.     Influenza A by PCR NEGATIVE NEGATIVE Final   Influenza B by PCR NEGATIVE NEGATIVE Final    Comment: (NOTE) The Xpert Xpress SARS-CoV-2/FLU/RSV assay is intended as an aid in  the diagnosis of influenza from Nasopharyngeal swab specimens and  should not be used as a sole basis for treatment. Nasal washings and  aspirates are unacceptable for Xpert Xpress SARS-CoV-2/FLU/RSV  testing.  Fact Sheet for Patients: PinkCheek.be  Fact Sheet for Healthcare Providers: GravelBags.it  This test is not yet approved or cleared by the [place] FDA and  has been authorized for detection and/or diagnosis of SARS-CoV-2 by  FDA under an Emergency Use Authorization (EUA). This EUA will remain  in effect (meaning this test can be used) for the duration of the  Covid-19 declaration under Section 564(b)(1) of the Act, 21  U.S.C. section 360bbb-3(b)(1), unless the authorization is  terminated or revoked.    Respiratory Syncytial Virus by PCR NEGATIVE NEGATIVE Final    Comment: (NOTE) Fact Sheet for  Patients: PinkCheek.be  Fact Sheet for Healthcare Providers: GravelBags.it  This test is not yet approved or cleared by the [place] FDA and  has been authorized for detection and/or diagnosis of SARS-CoV-2 by  FDA under an Emergency Use Authorization (EUA). This EUA will remain  in effect (meaning this  test can be used) for the duration of the  COVID-19 declaration under Section 564(b)(1) of the Act, 21 U.S.C.  section 360bbb-3(b)(1), unless the authorization is terminated or  revoked. Performed at [facility], [place] [address], [place], [place] [address]      Time coordinating discharge: 25 minutes The [place] controlled substances registry was reviewed for this patient prior to filling the <5 days supply controlled substances script.      SIGNED:   Edwin Dada, MD  [facility] 06/11/2020, 5:17 PM

## 2020-06-11 NOTE — Progress Notes (Signed)
AVS printed and reviewed with patient who verbalizes understanding of discharge instructions. PIV removed. Taken to main entrance for taxi cab home. Patient stable at time of discharge.

## 2020-06-11 NOTE — Progress Notes (Signed)
  Echocardiogram 2D Echocardiogram has been performed.  Lorraine Rose 06/11/2020, 8:47 AM

## 2020-07-04 DIAGNOSIS — N390 Urinary tract infection, site not specified: Secondary | ICD-10-CM | POA: Diagnosis not present

## 2020-07-16 ENCOUNTER — Other Ambulatory Visit: Payer: Self-pay | Admitting: Family Medicine

## 2020-07-16 DIAGNOSIS — Z0000 Encounter for general adult medical examination without abnormal findings: Secondary | ICD-10-CM | POA: Diagnosis not present

## 2020-07-16 DIAGNOSIS — F5101 Primary insomnia: Secondary | ICD-10-CM | POA: Diagnosis not present

## 2020-07-16 DIAGNOSIS — Z136 Encounter for screening for cardiovascular disorders: Secondary | ICD-10-CM | POA: Diagnosis not present

## 2020-07-16 DIAGNOSIS — M5416 Radiculopathy, lumbar region: Secondary | ICD-10-CM | POA: Diagnosis not present

## 2020-07-16 DIAGNOSIS — N6320 Unspecified lump in the left breast, unspecified quadrant: Secondary | ICD-10-CM

## 2020-07-16 DIAGNOSIS — F411 Generalized anxiety disorder: Secondary | ICD-10-CM | POA: Diagnosis not present

## 2020-07-16 DIAGNOSIS — Z1159 Encounter for screening for other viral diseases: Secondary | ICD-10-CM | POA: Diagnosis not present

## 2020-07-16 DIAGNOSIS — Z114 Encounter for screening for human immunodeficiency virus [HIV]: Secondary | ICD-10-CM | POA: Diagnosis not present

## 2020-07-16 DIAGNOSIS — M5417 Radiculopathy, lumbosacral region: Secondary | ICD-10-CM | POA: Diagnosis not present

## 2020-07-31 ENCOUNTER — Other Ambulatory Visit: Payer: Medicare HMO

## 2020-07-31 ENCOUNTER — Inpatient Hospital Stay: Admission: RE | Admit: 2020-07-31 | Payer: Medicare HMO | Source: Ambulatory Visit

## 2020-08-30 ENCOUNTER — Ambulatory Visit: Payer: Medicare HMO | Admitting: Student in an Organized Health Care Education/Training Program

## 2020-10-09 DEATH — deceased
